# Patient Record
Sex: Female | Born: 1960 | Race: White | Hispanic: No | Marital: Married | Smoking: Current every day smoker
Health system: Southern US, Community
[De-identification: ages and names within clinical notes are randomized; demographics above are authoritative.]

## PROBLEM LIST (undated history)

## (undated) DIAGNOSIS — K529 Noninfective gastroenteritis and colitis, unspecified: Secondary | ICD-10-CM

## (undated) DIAGNOSIS — C801 Malignant (primary) neoplasm, unspecified: Secondary | ICD-10-CM

## (undated) DIAGNOSIS — Z942 Lung transplant status: Secondary | ICD-10-CM

## (undated) DIAGNOSIS — J439 Emphysema, unspecified: Secondary | ICD-10-CM

## (undated) DIAGNOSIS — J449 Chronic obstructive pulmonary disease, unspecified: Secondary | ICD-10-CM

## (undated) HISTORY — PX: LUNG TRANSPLANT, DOUBLE: SHX704

## (undated) HISTORY — PX: TUBAL LIGATION: SHX77

---

## 2006-02-07 ENCOUNTER — Other Ambulatory Visit: Payer: Self-pay

## 2006-02-07 ENCOUNTER — Inpatient Hospital Stay: Payer: Self-pay | Admitting: Psychiatry

## 2006-02-15 ENCOUNTER — Ambulatory Visit: Payer: Self-pay

## 2006-02-16 ENCOUNTER — Ambulatory Visit: Payer: Self-pay

## 2006-05-10 ENCOUNTER — Inpatient Hospital Stay: Payer: Self-pay | Admitting: Internal Medicine

## 2006-05-10 ENCOUNTER — Other Ambulatory Visit: Payer: Self-pay

## 2006-08-28 ENCOUNTER — Emergency Department: Payer: Self-pay | Admitting: Internal Medicine

## 2006-10-20 ENCOUNTER — Other Ambulatory Visit: Payer: Self-pay

## 2006-10-20 ENCOUNTER — Inpatient Hospital Stay: Payer: Self-pay | Admitting: Internal Medicine

## 2007-01-31 ENCOUNTER — Encounter: Payer: Self-pay | Admitting: Internal Medicine

## 2007-02-21 ENCOUNTER — Encounter: Payer: Self-pay | Admitting: Internal Medicine

## 2007-03-24 ENCOUNTER — Encounter: Payer: Self-pay | Admitting: Internal Medicine

## 2007-04-24 ENCOUNTER — Encounter: Payer: Self-pay | Admitting: Internal Medicine

## 2007-05-24 ENCOUNTER — Encounter: Payer: Self-pay | Admitting: Internal Medicine

## 2007-07-23 ENCOUNTER — Emergency Department: Payer: Self-pay | Admitting: Emergency Medicine

## 2007-07-23 ENCOUNTER — Other Ambulatory Visit: Payer: Self-pay

## 2007-07-31 ENCOUNTER — Ambulatory Visit: Payer: Self-pay | Admitting: Internal Medicine

## 2008-04-22 ENCOUNTER — Ambulatory Visit: Payer: Self-pay | Admitting: Family Medicine

## 2008-10-11 ENCOUNTER — Inpatient Hospital Stay: Payer: Self-pay | Admitting: Internal Medicine

## 2008-10-14 ENCOUNTER — Ambulatory Visit: Payer: Self-pay | Admitting: Internal Medicine

## 2009-05-01 ENCOUNTER — Ambulatory Visit: Payer: Self-pay | Admitting: Internal Medicine

## 2010-01-25 ENCOUNTER — Ambulatory Visit: Payer: Self-pay | Admitting: Internal Medicine

## 2010-05-19 ENCOUNTER — Ambulatory Visit: Payer: Self-pay | Admitting: Internal Medicine

## 2010-08-23 DIAGNOSIS — Z942 Lung transplant status: Secondary | ICD-10-CM | POA: Insufficient documentation

## 2010-08-23 HISTORY — DX: Lung transplant status: Z94.2

## 2010-09-03 ENCOUNTER — Ambulatory Visit: Payer: Self-pay | Admitting: Internal Medicine

## 2010-09-11 ENCOUNTER — Ambulatory Visit: Payer: Self-pay | Admitting: Internal Medicine

## 2011-06-04 DIAGNOSIS — E099 Drug or chemical induced diabetes mellitus without complications: Secondary | ICD-10-CM | POA: Insufficient documentation

## 2011-06-04 DIAGNOSIS — D849 Immunodeficiency, unspecified: Secondary | ICD-10-CM | POA: Insufficient documentation

## 2011-06-04 DIAGNOSIS — Z942 Lung transplant status: Secondary | ICD-10-CM | POA: Insufficient documentation

## 2011-07-02 DIAGNOSIS — Z0001 Encounter for general adult medical examination with abnormal findings: Secondary | ICD-10-CM | POA: Insufficient documentation

## 2011-07-02 DIAGNOSIS — Z79899 Other long term (current) drug therapy: Secondary | ICD-10-CM | POA: Insufficient documentation

## 2011-10-27 ENCOUNTER — Ambulatory Visit: Payer: Self-pay | Admitting: Internal Medicine

## 2011-11-09 ENCOUNTER — Ambulatory Visit: Payer: Self-pay | Admitting: Internal Medicine

## 2011-12-01 DIAGNOSIS — E785 Hyperlipidemia, unspecified: Secondary | ICD-10-CM | POA: Insufficient documentation

## 2012-11-13 ENCOUNTER — Ambulatory Visit: Payer: Self-pay | Admitting: Internal Medicine

## 2013-11-15 ENCOUNTER — Ambulatory Visit: Payer: Self-pay | Admitting: Physician Assistant

## 2014-01-22 ENCOUNTER — Ambulatory Visit: Payer: Self-pay

## 2014-03-06 DIAGNOSIS — M1990 Unspecified osteoarthritis, unspecified site: Secondary | ICD-10-CM | POA: Insufficient documentation

## 2014-08-06 DIAGNOSIS — D801 Nonfamilial hypogammaglobulinemia: Secondary | ICD-10-CM | POA: Insufficient documentation

## 2014-11-06 ENCOUNTER — Ambulatory Visit: Payer: Self-pay | Admitting: Nurse Practitioner

## 2015-05-08 ENCOUNTER — Emergency Department: Payer: Medicaid Other

## 2015-05-08 ENCOUNTER — Emergency Department
Admission: EM | Admit: 2015-05-08 | Discharge: 2015-05-08 | Disposition: A | Discharge status: Other Acute Inpatient Hospital | Payer: Medicaid Other | Attending: Emergency Medicine | Admitting: Emergency Medicine

## 2015-05-08 DIAGNOSIS — A4189 Other specified sepsis: Secondary | ICD-10-CM | POA: Insufficient documentation

## 2015-05-08 DIAGNOSIS — Z87891 Personal history of nicotine dependence: Secondary | ICD-10-CM | POA: Diagnosis not present

## 2015-05-08 DIAGNOSIS — J189 Pneumonia, unspecified organism: Secondary | ICD-10-CM

## 2015-05-08 DIAGNOSIS — Z942 Lung transplant status: Secondary | ICD-10-CM | POA: Diagnosis not present

## 2015-05-08 DIAGNOSIS — R0602 Shortness of breath: Secondary | ICD-10-CM | POA: Diagnosis present

## 2015-05-08 DIAGNOSIS — A414 Sepsis due to anaerobes: Secondary | ICD-10-CM

## 2015-05-08 DIAGNOSIS — B961 Klebsiella pneumoniae [K. pneumoniae] as the cause of diseases classified elsewhere: Secondary | ICD-10-CM | POA: Insufficient documentation

## 2015-05-08 DIAGNOSIS — R000 Tachycardia, unspecified: Secondary | ICD-10-CM | POA: Diagnosis not present

## 2015-05-08 DIAGNOSIS — J441 Chronic obstructive pulmonary disease with (acute) exacerbation: Secondary | ICD-10-CM | POA: Diagnosis not present

## 2015-05-08 HISTORY — DX: Emphysema, unspecified: J43.9

## 2015-05-08 HISTORY — DX: Chronic obstructive pulmonary disease, unspecified: J44.9

## 2015-05-08 LAB — BLOOD GAS, ARTERIAL
Acid-base deficit: 2.5 mmol/L — ABNORMAL HIGH (ref 0.0–2.0)
Allens test (pass/fail): POSITIVE — AB
Bicarbonate: 21.7 mEq/L (ref 21.0–28.0)
FIO2: 1
O2 Saturation: 89.2 %
Patient temperature: 37
pCO2 arterial: 35 mmHg (ref 32.0–48.0)
pH, Arterial: 7.4 (ref 7.350–7.450)
pO2, Arterial: 57 mmHg — ABNORMAL LOW (ref 83.0–108.0)

## 2015-05-08 LAB — CBC WITH DIFFERENTIAL/PLATELET
Band Neutrophils: 10 %
Basophils Absolute: 0 10*3/uL (ref 0–0.1)
Basophils Relative: 0 %
Blasts: 0 %
Eosinophils Absolute: 0 10*3/uL (ref 0–0.7)
Eosinophils Relative: 0 %
HCT: 34 % — ABNORMAL LOW (ref 35.0–47.0)
Hemoglobin: 11.6 g/dL — ABNORMAL LOW (ref 12.0–16.0)
Lymphocytes Relative: 24 %
Lymphs Abs: 0.3 10*3/uL — ABNORMAL LOW (ref 1.0–3.6)
MCH: 29.7 pg (ref 26.0–34.0)
MCHC: 34 g/dL (ref 32.0–36.0)
MCV: 87.3 fL (ref 80.0–100.0)
Metamyelocytes Relative: 10 %
Monocytes Absolute: 0 10*3/uL — ABNORMAL LOW (ref 0.2–0.9)
Monocytes Relative: 1 %
Myelocytes: 0 %
Neutro Abs: 0.9 10*3/uL — ABNORMAL LOW (ref 1.4–6.5)
Neutrophils Relative %: 55 %
Other: 0 %
Platelets: 62 10*3/uL — ABNORMAL LOW (ref 150–440)
Promyelocytes Absolute: 0 %
RBC: 3.9 MIL/uL (ref 3.80–5.20)
RDW: 14.9 % — ABNORMAL HIGH (ref 11.5–14.5)
Smear Review: DECREASED
WBC: 1.2 10*3/uL — CL (ref 3.6–11.0)
nRBC: 0 /100 WBC

## 2015-05-08 LAB — COMPREHENSIVE METABOLIC PANEL
ALT: 21 U/L (ref 14–54)
AST: 46 U/L — ABNORMAL HIGH (ref 15–41)
Albumin: 4.3 g/dL (ref 3.5–5.0)
Alkaline Phosphatase: 42 U/L (ref 38–126)
Anion gap: 9 (ref 5–15)
BUN: 24 mg/dL — ABNORMAL HIGH (ref 6–20)
CO2: 23 mmol/L (ref 22–32)
Calcium: 8.5 mg/dL — ABNORMAL LOW (ref 8.9–10.3)
Chloride: 105 mmol/L (ref 101–111)
Creatinine, Ser: 1.73 mg/dL — ABNORMAL HIGH (ref 0.44–1.00)
GFR calc Af Amer: 37 mL/min — ABNORMAL LOW (ref >60)
GFR calc non Af Amer: 32 mL/min — ABNORMAL LOW (ref >60)
Glucose, Bld: 104 mg/dL — ABNORMAL HIGH (ref 65–99)
Potassium: 3.6 mmol/L (ref 3.5–5.1)
Sodium: 137 mmol/L (ref 135–145)
Total Bilirubin: 1 mg/dL (ref 0.3–1.2)
Total Protein: 6.8 g/dL (ref 6.5–8.1)

## 2015-05-08 LAB — PROTIME-INR
INR: 1.02
Prothrombin Time: 13.6 seconds (ref 11.4–15.0)

## 2015-05-08 LAB — LACTIC ACID, PLASMA
Lactic Acid, Venous: 2 mmol/L (ref 0.5–2.0)
Lactic Acid, Venous: 1.3 mmol/L (ref 0.5–2.0)

## 2015-05-08 LAB — TROPONIN I: Troponin I: <0.03 ng/mL (ref <0.031)

## 2015-05-08 MED ORDER — MIDAZOLAM HCL 2 MG/2ML IJ SOLN: 2.0000 mg | INTRAMUSCULAR | Status: DC | PRN

## 2015-05-08 MED ORDER — ETOMIDATE 2 MG/ML IV SOLN
20.0000 mg | Freq: Once | INTRAVENOUS | Status: DC
Start: 2015-05-08 — End: 2015-05-08

## 2015-05-08 MED ORDER — ONDANSETRON HCL 4 MG/2ML IJ SOLN
INTRAMUSCULAR | Status: AC
  Administered 2015-05-08: 4 mg via INTRAVENOUS
  Filled 2015-05-08: qty 2

## 2015-05-08 MED ORDER — NOREPINEPHRINE BITARTRATE 1 MG/ML IV SOLN
0.0000 ug/min | Freq: Once | INTRAVENOUS | Status: DC
  Administered 2015-05-08: 32 ug/min via INTRAVENOUS

## 2015-05-08 MED ORDER — PIPERACILLIN-TAZOBACTAM 3.375 G IVPB 30 MIN
3.3750 g | Freq: Once | INTRAVENOUS | Status: AC
  Administered 2015-05-08: 3.375 g via INTRAVENOUS
  Filled 2015-05-08: qty 50

## 2015-05-08 MED ORDER — SODIUM CHLORIDE 0.9 % IV BOLUS (SEPSIS)
500.0000 mL | Freq: Once | INTRAVENOUS | Status: AC
  Administered 2015-05-08: 500 mL via INTRAVENOUS

## 2015-05-08 MED ORDER — SODIUM CHLORIDE 0.9 % IV BOLUS (SEPSIS): 500.0000 mL | Freq: Once | INTRAVENOUS | Status: DC

## 2015-05-08 MED ORDER — VANCOMYCIN HCL IN DEXTROSE 1-5 GM/200ML-% IV SOLN
1000.0000 mg | Freq: Once | INTRAVENOUS | Status: AC
  Administered 2015-05-08: 1000 mg via INTRAVENOUS
  Filled 2015-05-08: qty 200

## 2015-05-08 MED ORDER — SODIUM CHLORIDE 0.9 % IV BOLUS (SEPSIS)
1000.0000 mL | Freq: Once | INTRAVENOUS | Status: AC
  Administered 2015-05-08: 1000 mL via INTRAVENOUS

## 2015-05-08 MED ORDER — ONDANSETRON HCL 4 MG/2ML IJ SOLN
4.0000 mg | Freq: Once | INTRAMUSCULAR | Status: AC
  Administered 2015-05-08: 4 mg via INTRAVENOUS

## 2015-05-08 MED ORDER — NOREPINEPHRINE 4 MG/250ML-% IV SOLN
INTRAVENOUS | Status: AC
  Filled 2015-05-08: qty 250

## 2015-05-08 MED ORDER — SUCCINYLCHOLINE CHLORIDE 20 MG/ML IJ SOLN: 120.0000 mg | Freq: Once | INTRAMUSCULAR | Status: DC

## 2015-05-08 MED ORDER — NOREPINEPHRINE 4 MG/250ML-% IV SOLN
0.0000 ug/min | Freq: Once | INTRAVENOUS | Status: AC
Start: 2015-05-08 — End: 2015-05-08
  Filled 2015-05-08: qty 250

## 2015-05-08 MED ORDER — ACETAMINOPHEN 500 MG PO TABS
1000.0000 mg | ORAL_TABLET | Freq: Once | ORAL | Status: AC
  Administered 2015-05-08: 1000 mg via ORAL
  Filled 2015-05-08: qty 2

## 2015-05-08 NOTE — ED Provider Notes (Signed)
[facility] Emergency Department Provider Note   ____________________________________________  Time seen: 11:15am I have reviewed the triage vital signs and the triage nursing note.  HISTORY  Chief Complaint Nausea; Emesis; and Diarrhea   Historian Patient, and daughter  HPI Katrina White is a 54 y.o. female who is a history of double lung transplant 4 years ago at [facility], who has a baseline oxygen saturation of 100% per the family, who about 3 days ago started to become somewhat short of breath and have generalized weakness and decreased activity level including fatigue. She's had nausea vomiting and diarrhea for the last 2 days as well. She's had a subjective fever. No chest pain. 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    Past Medical History  Diagnosis Date  . COPD (chronic obstructive pulmonary disease)   . Emphysema lung     There are no active problems to display for this patient.   Past Surgical History  Procedure Laterality Date  . Lung transplant, double    . Tubal ligation      No current outpatient prescriptions on file.  Did not bring her medication list but remembers Bactrim 3 times daily for prevention of infection Mycophenalate Prednisone  Allergies Review of patient's allergies indicates no known allergies.  No family history on file.  Social History Social History  Substance Use Topics  . Smoking status: Former Research scientist (life sciences)  . Smokeless tobacco: None  . Alcohol Use: No    Review of Systems  Constitutional: Positive for fever. Eyes: Negative for visual changes. ENT: Negative for sore throat. Cardiovascular: Negative for chest pain. Respiratory: Positive for shortness of breath. Positive for cough, nonproductive of sputum. Gastrointestinal: Positive for vomiting and diarrhea. Genitourinary: Negative for dysuria. Musculoskeletal: Negative for back pain. Skin: Negative for  rash. Neurological: Negative for headache. 10 point Review of Systems otherwise negative ____________________________________________   PHYSICAL EXAM:  VITAL SIGNS: ED Triage Vitals  Enc Vitals Group     BP 05/08/15 1102 113/65 mmHg     Pulse Rate 05/08/15 1102 123     Resp 05/08/15 1102 22     Temp 05/08/15 1102 100.2 F (37.9 C)     Temp Source 05/08/15 1102 Oral     SpO2 05/08/15 1102 80 %     Weight 05/08/15 1102 110 lb (49.896 kg)     Height 05/08/15 1102 5\' 4"  (1.626 m)     Head Cir --      Peak Flow --      Pain Score 05/08/15 1104 8     Pain Loc --      Pain Edu? --      Excl. in [place]? --      Constitutional: Alert and oriented. Well appearing and in no distress. Eyes: Conjunctivae are normal. PERRL. Normal extraocular movements. ENT   Head: Normocephalic and atraumatic.   Nose: No congestion/rhinnorhea.   Mouth/Throat: Mucous membranes are moist.   Neck: No stridor. Cardiovascular/Chest: Tachycardic, regular rhythm.  No murmurs, rubs, or gallops. Respiratory: Normal respiratory effort without tachypnea nor retractions. Breath sounds are  equal bilaterally. No wheezes/rales.  Mild rhonchi more right-sided. Gastrointestinal: Soft. No distention, no guarding, no rebound. Nontender   Genitourinary/rectal:Deferred Musculoskeletal: Nontender with normal range of motion in all extremities. No joint effusions.  No lower extremity tenderness.  No edema. Neurologic:  Normal speech and language. No gross or focal neurologic deficits are appreciated. Skin:  Skin is warm, dry and intact. No  rash noted. Psychiatric: Mood and affect are normal. Speech and behavior are normal. Patient exhibits appropriate insight and judgment.  ____________________________________________   EKG I, Lisa Roca, MD, the attending physician have personally viewed and interpreted all ECGs.  110 bpm sinus x-ray. Narrow QRS. Normal axis. Nonspecific T  wave. ____________________________________________  LABS (pertinent positives/negatives)  Lactic acid 2.0 Comprehensive metabolic panel significant for BUN 24 cranny 1.73, otherwise without significant abnormality Troponin less than 0.03 White blood cell count 1.2, bands 10.  Absolute neutrophil 0.9 Hemoglobin 11.6, platelet count 62 2 blood cultures sent  PH on nonrebreather 7.4, PCO2 35, PO2 57, bicarbonate 21.7, acid base deficit 2.5, O2 sat 89%  ____________________________________________  RADIOLOGY All Xrays were viewed by me. Imaging interpreted by Radiologist.  Chest x-ray portable: Right-sided pneumonia  Interpreted by radiologist: IMPRESSION: Patchy airspace consolidation within the right midlung. No posttransplant chest radiograph is available for comparison, and therefore it is difficult to determine whether this represents chronic post transplant changes or acute airspace consolidation. Favor acute consolidation given the asymmetry from the left side. __________________________________________  PROCEDURES  Procedure(s) performed: None  Critical Care performed: CRITICAL CARE Performed by: Lisa Roca   Total critical care time: 75 minutes  Critical care time was exclusive of separately billable procedures and treating other patients.  Critical care was necessary to treat or prevent imminent or life-threatening deterioration.  Critical care was time spent personally by me on the following activities: development of treatment plan with patient and/or surrogate as well as nursing, discussions with consultants, evaluation of patient's response to treatment, examination of patient, obtaining history from patient or surrogate, ordering and performing treatments and interventions, ordering and review of laboratory studies, ordering and review of radiographic studies, pulse oximetry and re-evaluation of patient's  condition.   ____________________________________________   ED COURSE / ASSESSMENT AND PLAN  CONSULTATIONS: Phone consultation with lung transplant physician Dr. Pearline Cables who recommended 2 L normal saline bolus followed by 500 cc 2 for total of 3 L bolus, and then if still hypotensive at Levothroid. I also discussed the patient with the accepting physician for the medical ICU, Dr. Ruthann Cancer.  Pertinent labs & imaging results that were available during my care of the patient were reviewed by me and considered in my medical decision making (see chart for details).  Patient arrived with hypoxia and hypotension, concerning for possible sepsis. Patient is a double lung transplant patient who is on immunosuppressants. She has been stable for possibly for years with no baseline O2 requirement.  So this is definitely a change. On nasal cannula at 4 L she was approximately 89%. However when I had her inhale through her nose this did bump up into the mid 90s. However she is not maintaining O2 sat about 90% on nasal cannula, so she was changed to a nonrebreather. On that are with her she was 97%. She's not having any respiratory distress in terms of increased respiratory rate or difficulty speaking. She is able to talk in full sentences. She had a chest pain.  Chest x-ray showed right-sided pneumonia, and her white blood cell count is low risk and concern for neutropenia. And see his 900. Patient was treated with vancomycin and Zosyn as broad-spectrum coverage for sepsis due to pneumonia.  Patient was given 1 L normal saline bolus, and although initial systolic blood pressure was in the 112 area, she did drop her blood pressure into the 66A systolically.  After second liter normal saline, patient's blood pressure was still in the  82-64 systolic range.  At this point I was able to get in contact with [facility] for patient transfer to the medical ICU, after consultation with lung transplant on call physician Dr.  Pearline Cables. She suggested third liter bolus and if hypotensive after that, started Levophed.  She also recommended ABG. This was done and pH is normal.  After the third liter normal saline, patient still hypotensive with a systolic in the 15A, Levothroid was ordered to be started with a goal blood pressure above 100. She does have 2 blood flowing peripheral IVs at this point. She still tachycardic at about 120 bpm.  Patient's respiratory status is stable, she is able to talk comfortably, and is on nonrebreather at around 95% sat. I discussed with her that I don't think she needs intubation this point in time, however is a possibility. Daughter and patient understand.  I'm awaiting bed assignment in the MICU at [facility].  Patient had an episode where her O2 sat dropped into the 80s, and the nurse come to bedside. Patient was actively vomiting. Her waveform was good and her O2 sat was in the 80s. Her blood pressure was still a systolic of 90 and starting on levothyroid at that time at 2 mics per min. As patient stopped vomiting, and preparations were made to consider intubation, and I was trying to await blood pressure did come up a little bit prior to induction medications, including sedative.  Patient's O2 sat did come up on her nonrebreather into the mid 90s, and she is much more comfortable. As I was titrating the levophed for blood pressure control, it was increased to 20 mcg/m. Her breathing did come under good control, with an O2 sat of 100% on nonrebreather. Patient is comfortable breathing and able to talk easily.  At 25 mics/min patient's systolic blood pressure 309. Heart rate 101. O2 sat 100% on nonrebreather.  I discussed prophylactic intubation with the patient and the family, however I don't think this is necessary at this point in time with her O2 sat stabilized, and she looks clinically well with respect to her respiratory status. I think the episode of hypoxia was related to hypoventilation during  nausea and vomiting.  I updated the ICU accepting physician Dr.Marshall who is in agreement with this plan at this time.  Patient care transferred to oncoming physician Dr.Gayle, at 4:20 PM. Patient is waiting critical care transport to [facility] ICU.  Patient / Family / Caregiver informed of clinical course, medical decision-making process, and agree with plan.    ___________________________________________   FINAL CLINICAL IMPRESSION(S) / ED DIAGNOSES   Final diagnoses:  Pneumonia involving right lung, unspecified part of lung  Sepsis due to Klebsiella pneumoniae       Lisa Roca, MD 05/08/15 [id number]

## 2015-05-08 NOTE — ED Notes (Signed)
Pt c/o N/V/D for the past 2 days with fever.the patient has had a double lung transplant pt.Marland Kitchen

## 2015-05-08 NOTE — ED Notes (Signed)
Patient placed on non-rebreather at 14L

## 2015-05-08 NOTE — ED Notes (Signed)
MD notified of decreasing BP.  2nd bolus of fluids ordered.

## 2015-05-13 LAB — CULTURE, BLOOD (ROUTINE X 2)
Culture: NO GROWTH
Culture: NO GROWTH

## 2015-06-04 DIAGNOSIS — Z87448 Personal history of other diseases of urinary system: Secondary | ICD-10-CM | POA: Insufficient documentation

## 2015-06-04 DIAGNOSIS — Z8679 Personal history of other diseases of the circulatory system: Secondary | ICD-10-CM | POA: Insufficient documentation

## 2015-09-19 ENCOUNTER — Ambulatory Visit
Admission: RE | Admit: 2015-09-19 | Discharge: 2015-09-19 | Disposition: A | Discharge status: Home / Self Care | Payer: Medicaid Other | Source: Ambulatory Visit | Attending: Nurse Practitioner | Admitting: Nurse Practitioner

## 2015-09-19 ENCOUNTER — Other Ambulatory Visit: Payer: Self-pay | Admitting: Nurse Practitioner

## 2015-09-19 DIAGNOSIS — M79604 Pain in right leg: Secondary | ICD-10-CM

## 2015-09-19 DIAGNOSIS — M8589 Other specified disorders of bone density and structure, multiple sites: Secondary | ICD-10-CM | POA: Insufficient documentation

## 2015-09-19 DIAGNOSIS — M25551 Pain in right hip: Secondary | ICD-10-CM

## 2015-10-11 DIAGNOSIS — F3341 Major depressive disorder, recurrent, in partial remission: Secondary | ICD-10-CM | POA: Insufficient documentation

## 2015-11-14 DIAGNOSIS — R2689 Other abnormalities of gait and mobility: Secondary | ICD-10-CM | POA: Insufficient documentation

## 2016-01-26 ENCOUNTER — Other Ambulatory Visit: Payer: Self-pay | Admitting: Nurse Practitioner

## 2016-01-26 DIAGNOSIS — Z1231 Encounter for screening mammogram for malignant neoplasm of breast: Secondary | ICD-10-CM

## 2016-02-10 ENCOUNTER — Ambulatory Visit
Admission: RE | Admit: 2016-02-10 | Discharge: 2016-02-10 | Disposition: A | Discharge status: Home / Self Care | Payer: Medicaid Other | Source: Ambulatory Visit | Attending: Nurse Practitioner | Admitting: Nurse Practitioner

## 2016-02-10 DIAGNOSIS — Z1231 Encounter for screening mammogram for malignant neoplasm of breast: Secondary | ICD-10-CM | POA: Diagnosis present

## 2016-02-10 HISTORY — DX: Lung transplant status: Z94.2

## 2016-08-30 DIAGNOSIS — Z85828 Personal history of other malignant neoplasm of skin: Secondary | ICD-10-CM | POA: Insufficient documentation

## 2016-10-29 ENCOUNTER — Ambulatory Visit
Admission: RE | Admit: 2016-10-29 | Discharge: 2016-10-29 | Disposition: A | Discharge status: Home / Self Care | Payer: Medicaid Other | Source: Ambulatory Visit | Attending: Nurse Practitioner | Admitting: Nurse Practitioner

## 2016-10-29 ENCOUNTER — Other Ambulatory Visit: Payer: Self-pay | Admitting: Nurse Practitioner

## 2016-10-29 DIAGNOSIS — J189 Pneumonia, unspecified organism: Secondary | ICD-10-CM

## 2016-10-29 DIAGNOSIS — R062 Wheezing: Secondary | ICD-10-CM | POA: Diagnosis present

## 2016-12-29 ENCOUNTER — Other Ambulatory Visit: Payer: Self-pay | Admitting: Nurse Practitioner

## 2017-01-24 ENCOUNTER — Other Ambulatory Visit: Payer: Self-pay | Admitting: Nurse Practitioner

## 2017-01-24 DIAGNOSIS — Z1231 Encounter for screening mammogram for malignant neoplasm of breast: Secondary | ICD-10-CM

## 2017-05-17 ENCOUNTER — Ambulatory Visit
Admission: RE | Admit: 2017-05-17 | Discharge: 2017-05-17 | Disposition: A | Discharge status: Home / Self Care | Payer: Medicaid Other | Source: Ambulatory Visit | Attending: Nurse Practitioner | Admitting: Nurse Practitioner

## 2017-05-17 DIAGNOSIS — Z1231 Encounter for screening mammogram for malignant neoplasm of breast: Secondary | ICD-10-CM | POA: Insufficient documentation

## 2017-09-28 ENCOUNTER — Other Ambulatory Visit: Payer: Self-pay

## 2017-09-28 ENCOUNTER — Encounter: Payer: Self-pay | Admitting: Emergency Medicine

## 2017-09-28 ENCOUNTER — Emergency Department
Admission: EM | Admit: 2017-09-28 | Discharge: 2017-09-28 | Disposition: A | Discharge status: Home / Self Care | Payer: Medicaid Other | Attending: Emergency Medicine | Admitting: Emergency Medicine

## 2017-09-28 DIAGNOSIS — Z87891 Personal history of nicotine dependence: Secondary | ICD-10-CM | POA: Diagnosis not present

## 2017-09-28 DIAGNOSIS — S67190A Crushing injury of right index finger, initial encounter: Secondary | ICD-10-CM | POA: Diagnosis present

## 2017-09-28 DIAGNOSIS — Y999 Unspecified external cause status: Secondary | ICD-10-CM | POA: Insufficient documentation

## 2017-09-28 DIAGNOSIS — S61217A Laceration without foreign body of left little finger without damage to nail, initial encounter: Secondary | ICD-10-CM | POA: Diagnosis not present

## 2017-09-28 DIAGNOSIS — W231XXA Caught, crushed, jammed, or pinched between stationary objects, initial encounter: Secondary | ICD-10-CM | POA: Insufficient documentation

## 2017-09-28 DIAGNOSIS — Y939 Activity, unspecified: Secondary | ICD-10-CM | POA: Insufficient documentation

## 2017-09-28 DIAGNOSIS — Y929 Unspecified place or not applicable: Secondary | ICD-10-CM | POA: Diagnosis not present

## 2017-09-28 DIAGNOSIS — S61210A Laceration without foreign body of right index finger without damage to nail, initial encounter: Secondary | ICD-10-CM

## 2017-09-28 DIAGNOSIS — J449 Chronic obstructive pulmonary disease, unspecified: Secondary | ICD-10-CM | POA: Diagnosis not present

## 2017-09-28 NOTE — ED Notes (Addendum)
Pt has laceration to right index finger and left 5th finger.  Pt was moving a box and struck the metal shelving with her hands.  Bleeding controlled.  Good distal pulses.  Pt alert.

## 2017-09-28 NOTE — Discharge Instructions (Signed)
Please keep dry for the next 24 hours. Please return with any worsened pain, redness or drainage to the area

## 2017-09-28 NOTE — ED Triage Notes (Signed)
Patient ambulatory to triage with steady gait, without difficulty or distress noted; Pt reports about 3hrs PTA stacking boxes on metal shelf; laceration to right index finger and left 5th finger

## 2017-09-28 NOTE — ED Provider Notes (Signed)
[facility] Emergency Department Provider Note   ____________________________________________   First MD Initiated Contact with Patient 09/28/17 0032     (approximate)  I have reviewed the triage vital signs and the nursing notes.   HISTORY  Chief Complaint Laceration    HPI Katrina White is a 57 y.o. female who comes into the hospital today with a laceration to her fingers.  She reports that she smashed her fingers under some boxes.  She was putting some boxes away on a metal shelf that was 10 pounds each.  She reports that when she tried to put on the shelf it slammed down on her fingers.  The patient reports that her last tetanus was about 6 years ago.  The patient states that her pain is a 4 out of 10 in intensity.  She injured her right index finger in her left pinky finger.  She reports that she had a numb feeling initially but it is improving.  She just wanted a doctor to look at it.  She thinks it looks okay but wanted evaluated.  She put some liquid bandage on the index finger injury to help with the bleeding.  Past Medical History:  Diagnosis Date  . COPD (chronic obstructive pulmonary disease) ([place])   . Emphysema lung ([place])   . 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   Past Surgical History:  Procedure Laterality Date  . LUNG TRANSPLANT, DOUBLE    . TUBAL LIGATION      Prior to Admission medications   Not on File    Allergies Nsaids  Family History  Problem Relation Age of Onset  . Breast cancer Neg Hx     Social History Social History   Tobacco Use  . Smoking status: Former Research scientist (life sciences)  . Smokeless tobacco: Never Used  Substance Use Topics  . Alcohol use: No  . Drug use: Not on file    Review of Systems  Constitutional: No fever/chills Eyes: No visual changes. ENT: No sore throat. Cardiovascular: Denies chest pain. Respiratory: Denies shortness of  breath. Gastrointestinal: No abdominal pain.  No nausea, no vomiting.  No diarrhea.  No constipation. Genitourinary: Negative for dysuria. Musculoskeletal: Negative for back pain. Skin: Laceration to left small finger and right index finger Neurological: Negative for headaches, focal weakness or numbness.   ____________________________________________   PHYSICAL EXAM:  VITAL SIGNS: ED Triage Vitals  Enc Vitals Group     BP 09/28/17 0021 (!) 131/93     Pulse Rate 09/28/17 0021 96     Resp 09/28/17 0021 18     Temp 09/28/17 0021 97.7 F (36.5 C)     Temp Source 09/28/17 0021 Oral     SpO2 09/28/17 0021 98 %     Weight 09/28/17 0020 100 lb (45.4 kg)     Height 09/28/17 0020 5\' 4"  (1.626 m)     Head Circumference --      Peak Flow --      Pain Score 09/28/17 0021 3     Pain Loc --      Pain Edu? --      Excl. in [place]? --     Constitutional: Alert and oriented. Well appearing and in mild distress. Eyes: Conjunctivae are normal. PERRL. EOMI. Head: Atraumatic. Nose: No congestion/rhinnorhea. Mouth/Throat: Mucous membranes are moist.  Oropharynx non-erythematous. Cardiovascular: Normal rate, regular rhythm. Grossly normal heart sounds.  Good peripheral  circulation. Respiratory: Normal respiratory effort.  No retractions. Lungs CTAB. Gastrointestinal: Soft and nontender. No distention.  Positive bowel sounds Musculoskeletal: No lower extremity tenderness nor edema.   Neurologic:  Normal speech and language.  Skin:  Skin is warm, dry small skin tear to right index finger between the DIP and PIP.  Skin wound approximated well, skin tear to left fifth digit above the DIP edges approximated well Psychiatric: Mood and affect are normal.   ____________________________________________   LABS (all labs ordered are listed, but only abnormal results are displayed)  Labs Reviewed - No data to  display ____________________________________________  EKG  none ____________________________________________  RADIOLOGY  ED MD interpretation:  none  Official radiology report(s): No results found.  ____________________________________________   PROCEDURES  Procedure(s) performed: None  Procedures  Critical Care performed: No  ____________________________________________   INITIAL IMPRESSION / ASSESSMENT AND PLAN / ED COURSE  As part of my medical decision making, I reviewed the following data within the electronic MEDICAL RECORD NUMBER Notes from prior ED visits and  Controlled Substance Database   This is a 57 year old female who comes into the hospital today with some lacerations to her right index finger and left fifth small finger.  The lacerations are skin tears that are not gaping open.  They do approximate well and the bleeding is controlled.  I do not feel that the patient's injuries needs sutures.  I did use some Steri-Strips to pull the wound together on her index finger and small finger.  The patient has been instructed to bandage it to help keep it from moving for the next 24 hours and to not get it wet for 24 hours.  The patient will be discharged to follow-up with her primary care physician.      ____________________________________________   FINAL CLINICAL IMPRESSION(S) / ED DIAGNOSES  Final diagnoses:  Laceration of right index finger without foreign body without damage to nail, initial encounter  Laceration of left little finger without foreign body without damage to nail, initial encounter     ED Discharge Orders    None       Note:  This document was prepared using Dragon voice recognition software and may include unintentional dictation errors.    Loney Hering, MD 09/28/17 [phone]

## 2017-10-07 DIAGNOSIS — F17200 Nicotine dependence, unspecified, uncomplicated: Secondary | ICD-10-CM | POA: Insufficient documentation

## 2017-10-09 DIAGNOSIS — N183 Chronic kidney disease, stage 3 unspecified: Secondary | ICD-10-CM | POA: Insufficient documentation

## 2017-10-09 DIAGNOSIS — E46 Unspecified protein-calorie malnutrition: Secondary | ICD-10-CM | POA: Insufficient documentation

## 2017-10-31 ENCOUNTER — Ambulatory Visit: Payer: Self-pay | Admitting: Nurse Practitioner

## 2017-11-24 ENCOUNTER — Ambulatory Visit: Payer: Self-pay | Admitting: Nurse Practitioner

## 2017-12-30 ENCOUNTER — Ambulatory Visit: Payer: Medicaid Other | Admitting: Nurse Practitioner

## 2017-12-30 ENCOUNTER — Encounter: Payer: Self-pay | Admitting: Nurse Practitioner

## 2017-12-30 VITALS — BP 111/75 | HR 72 | Resp 16 | Ht 64.0 in | Wt 97.4 lb

## 2017-12-30 DIAGNOSIS — R197 Diarrhea, unspecified: Secondary | ICD-10-CM

## 2017-12-30 DIAGNOSIS — M064 Inflammatory polyarthropathy: Secondary | ICD-10-CM | POA: Diagnosis not present

## 2017-12-30 DIAGNOSIS — Z942 Lung transplant status: Secondary | ICD-10-CM

## 2017-12-30 MED ORDER — PREDNISONE 10 MG (21) PO TBPK: ORAL_TABLET | ORAL | 0 refills | Status: AC

## 2017-12-30 NOTE — Progress Notes (Signed)
[facility] [place], [place] [address]  Internal MEDICINE  Office Visit Note  Patient Name: Katrina White  [id number]  [id number]  Date of Service: 01/23/2018   Pt is here for routine follow up.   Chief Complaint  Patient presents with  . Follow-up    30month follow up.  . swollen hands    has been swollen a while went to urgent care and got an xray they said ontusion but pt doesnt believe that is the case. very painful    The patient has swelling and tenderness in the joints of her hands. Hurts to make a fist or move her fingers. Was seen in urgent care a few weeks ago because of this pain. Was told she had hematoma to dorsal surface of right hand and some arthritic cells in the joints. 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  She is also c/o diarrhea. Has 6 to 8 loose bowel movements per day. Will sometimes get nauseated and vomit during these episodes. Has had C.diff in the past due to medications from lung transplant. She states that she isn't sure, but this feels very similar. Denies cramping or stomach pain. Denies fever.       Current Medication: Outpatient Encounter Medications as of 12/30/2017  Medication Sig  . acetaminophen (TYLENOL) 500 MG tablet Take by mouth.  Marland Kitchen aspirin 81 MG chewable tablet Chew by mouth.  Marland Kitchen atorvastatin (LIPITOR) 40 MG tablet Take by mouth.  Marland Kitchen atorvastatin (LIPITOR) 40 MG tablet Take 40 mg by mouth daily.  . Biotin (BIOTIN MAXIMUM STRENGTH) 5 MG CAPS Take by mouth.  . calcium citrate-vitamin D (CITRACAL+D) 315-200 MG-UNIT tablet Take by mouth.  . CHANTIX STARTING MONTH PAK 0.5 MG X 11 & 1 MG X 42 tablet FPD  . escitalopram (LEXAPRO) 10 MG tablet TK 1 T PO QD  . magnesium oxide (MAG-OX) 400 MG tablet Take by mouth.  . mirtazapine (REMERON) 15 MG tablet Take 15 mg by mouth at bedtime.  . Multiple Vitamin (MULTI-VITAMINS) TABS Take by mouth.  . mycophenolate  (CELLCEPT) 500 MG tablet TK 1 T PO BID  . predniSONE (DELTASONE) 5 MG tablet TK 1 T PO  QD  . sulfamethoxazole-trimethoprim (BACTRIM,SEPTRA) 400-80 MG tablet Take by mouth.  . tacrolimus (PROGRAF) 1 MG capsule Take 2 mg by mouth 2 (two) times daily.  Marland Kitchen thiamine 100 MG tablet Take by mouth.  . predniSONE (STERAPRED UNI-PAK 21 TAB) 10 MG (21) TBPK tablet 6 day taper - take by mouth as directed for 6 days   No facility-administered encounter medications on file as of 12/30/2017.     Surgical History: Past Surgical History:  Procedure Laterality Date  . LUNG TRANSPLANT, DOUBLE    . TUBAL LIGATION      Medical History: Past Medical History:  Diagnosis Date  . COPD (chronic obstructive pulmonary disease) ([place])   . Emphysema lung ([place])   . 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 Breast cancer Neg Hx     Social History   Socioeconomic History  . Marital status: Married    Spouse name: Not on file  . Number of children: Not on file  . Years of education: Not on file  . Highest education level: Not on file  Occupational History  . Not on file  Social Needs  . Financial  resource strain: Not on file  . Food insecurity:    Worry: Not on file    Inability: Not on file  . Transportation needs:    Medical: Not on file    Non-medical: Not on file  Tobacco Use  . Smoking status: Current Every Day Smoker    Types: Cigarettes  . Smokeless tobacco: Never Used  . Tobacco comment: less than a pack a week plan to quit for mothers day  Substance and Sexual Activity  . Alcohol use: Yes    Comment: socially (3-4drinks a week)  . Drug use: Never  . Sexual activity: Not on file  Lifestyle  . Physical activity:    Days per week: Not on file    Minutes per session: Not on file  . Stress: Not on file  Relationships  . Social connections:    Talks on phone: Not on file    Gets together: Not on file    Attends  religious service: Not on file    Active member of club or organization: Not on file    Attends meetings of clubs or organizations: Not on file    Relationship status: Not on file  . Intimate partner violence:    Fear of current or ex partner: Not on file    Emotionally abused: Not on file    Physically abused: Not on file    Forced sexual activity: Not on file  Other Topics Concern  . Not on file  Social History Narrative  . Not on file      Review of Systems  Constitutional: Positive for fatigue. Negative for chills and unexpected weight change.  HENT: Negative for congestion, postnasal drip, rhinorrhea, sneezing and sore throat.   Eyes: Negative.  Negative for redness.  Respiratory: Negative for cough, chest tightness, shortness of breath and wheezing.   Cardiovascular: Negative for chest pain and palpitations.  Gastrointestinal: Positive for diarrhea. Negative for abdominal pain, constipation, nausea and vomiting.       Intermittent cramping with frequent loose stools.  Endocrine: Negative for cold intolerance, heat intolerance, polydipsia, polyphagia and polyuria.  Genitourinary: Negative for dysuria and frequency.  Musculoskeletal: Positive for arthralgias and myalgias. Negative for back pain, joint swelling and neck pain.  Skin: Negative for rash.  Allergic/Immunologic: Positive for environmental allergies.  Neurological: Negative.  Negative for tremors and numbness.  Hematological: Negative for adenopathy. Does not bruise/bleed easily.  Psychiatric/Behavioral: Negative for behavioral problems (Depression), sleep disturbance and suicidal ideas. The patient is not nervous/anxious.     Today's Vitals   12/30/17 1356  BP: 111/75  Pulse: 72  Resp: 16  SpO2: 94%  Weight: 97 lb 6.4 oz (44.2 kg)  Height: 5\' 4"  (1.626 m)    Physical Exam  Constitutional: She is oriented to person, place, and time. She appears well-developed and well-nourished. No distress.  HENT:  Head:  Normocephalic and atraumatic.  Nose: Nose normal.  Mouth/Throat: Oropharynx is clear and moist. No oropharyngeal exudate.  Eyes: Pupils are equal, round, and reactive to light. Conjunctivae and EOM are normal.  Neck: Normal range of motion. Neck supple. No JVD present. No tracheal deviation present. No thyromegaly present.  Cardiovascular: Normal rate, regular rhythm and normal heart sounds. Exam reveals no gallop and no friction rub.  No murmur heard. Pulmonary/Chest: Effort normal and breath sounds normal. No respiratory distress. She has no wheezes. She has no rales. She exhibits no tenderness.  Abdominal: Soft. Bowel sounds are normal. There is tenderness.  Mild  generalized abdominal tenderness. No guarding with light and moderate palpation.   Musculoskeletal: Normal range of motion.  Mild tenderness and moderate stiffness of the joints of the fingers. Most severe in medial and proximal joints of the fingers.   Lymphadenopathy:    She has no cervical adenopathy.  Neurological: She is alert and oriented to person, place, and time. No cranial nerve deficit.  Skin: Skin is warm and dry. She is not diaphoretic.  Psychiatric: She has a normal mood and affect. Her behavior is normal. Judgment and thought content normal.  Nursing note and vitals reviewed.  Assessment/Plan:  1. Inflammatory polyarthritis (HCC) Prednisone 10mg  dose pack. Take as directed for 6 days to reduce pain and inflammation of the fingers/hands.  - predniSONE (STERAPRED UNI-PAK 21 TAB) 10 MG (21) TBPK tablet; 6 day taper - take by mouth as directed for 6 days  Dispense: 21 tablet; Refill: 0  2. Diarrhea, unspecified type Check stool for presence of infection. Treat as indicated.  - Cdiff NAA+O+P+Stool Culture  3. S/P lung transplant ([place]) Regular visits with lung transplant center as scheduled    General Counseling: Sherrelle verbalizes understanding of the findings of todays visit and agrees with plan of treatment. I  have discussed any further diagnostic evaluation that may be needed or ordered today. We also reviewed her medications today. she has been encouraged to call the office with any questions or concerns that should arise related to todays visit.  This patient was seen by Leretha Pol, FNP- C in Collaboration with Dr Lavera Guise as a part of collaborative care agreement   Orders Placed This Encounter  Procedures  . Cdiff NAA+O+P+Stool Culture    Meds ordered this encounter  Medications  . predniSONE (STERAPRED UNI-PAK 21 TAB) 10 MG (21) TBPK tablet    Sig: 6 day taper - take by mouth as directed for 6 days    Dispense:  21 tablet    Refill:  0    Order Specific Question:   Supervising Provider    Answer:   Lavera Guise [[id number]]    Time spent: 2 Minutes     Dr Lavera Guise Internal medicine

## 2018-01-23 DIAGNOSIS — M064 Inflammatory polyarthropathy: Secondary | ICD-10-CM | POA: Insufficient documentation

## 2018-01-23 DIAGNOSIS — R197 Diarrhea, unspecified: Secondary | ICD-10-CM | POA: Insufficient documentation

## 2018-04-07 ENCOUNTER — Emergency Department
Admission: EM | Admit: 2018-04-07 | Discharge: 2018-04-07 | Disposition: A | Discharge status: Home / Self Care | Payer: Medicaid Other | Attending: Emergency Medicine | Admitting: Emergency Medicine

## 2018-04-07 ENCOUNTER — Other Ambulatory Visit: Payer: Self-pay

## 2018-04-07 ENCOUNTER — Emergency Department: Payer: Medicaid Other

## 2018-04-07 ENCOUNTER — Encounter: Payer: Self-pay | Admitting: Emergency Medicine

## 2018-04-07 DIAGNOSIS — Z79899 Other long term (current) drug therapy: Secondary | ICD-10-CM | POA: Diagnosis not present

## 2018-04-07 DIAGNOSIS — Z7982 Long term (current) use of aspirin: Secondary | ICD-10-CM | POA: Diagnosis not present

## 2018-04-07 DIAGNOSIS — J449 Chronic obstructive pulmonary disease, unspecified: Secondary | ICD-10-CM | POA: Diagnosis not present

## 2018-04-07 DIAGNOSIS — F17210 Nicotine dependence, cigarettes, uncomplicated: Secondary | ICD-10-CM | POA: Diagnosis not present

## 2018-04-07 DIAGNOSIS — K529 Noninfective gastroenteritis and colitis, unspecified: Secondary | ICD-10-CM | POA: Diagnosis not present

## 2018-04-07 DIAGNOSIS — R197 Diarrhea, unspecified: Secondary | ICD-10-CM | POA: Diagnosis present

## 2018-04-07 HISTORY — DX: Malignant (primary) neoplasm, unspecified: C80.1

## 2018-04-07 LAB — GASTROINTESTINAL PANEL BY PCR, STOOL (REPLACES STOOL CULTURE)

## 2018-04-07 LAB — URINALYSIS, COMPLETE (UACMP) WITH MICROSCOPIC
Bilirubin Urine: NEGATIVE
Glucose, UA: NEGATIVE mg/dL
Ketones, ur: NEGATIVE mg/dL
Leukocytes, UA: NEGATIVE
Nitrite: NEGATIVE
Protein, ur: NEGATIVE mg/dL
Specific Gravity, Urine: 1.018 (ref 1.005–1.030)
pH: 5 (ref 5.0–8.0)

## 2018-04-07 LAB — COMPREHENSIVE METABOLIC PANEL
ALT: 17 U/L (ref 0–44)
AST: 28 U/L (ref 15–41)
Albumin: 4.3 g/dL (ref 3.5–5.0)
Alkaline Phosphatase: 43 U/L (ref 38–126)
Anion gap: 7 (ref 5–15)
BUN: 29 mg/dL — ABNORMAL HIGH (ref 6–20)
CO2: 28 mmol/L (ref 22–32)
Calcium: 9.2 mg/dL (ref 8.9–10.3)
Chloride: 106 mmol/L (ref 98–111)
Creatinine, Ser: 1.21 mg/dL — ABNORMAL HIGH (ref 0.44–1.00)
GFR calc Af Amer: 56 mL/min — ABNORMAL LOW (ref >60)
GFR calc non Af Amer: 48 mL/min — ABNORMAL LOW (ref >60)
Glucose, Bld: 125 mg/dL — ABNORMAL HIGH (ref 70–99)
Potassium: 3.6 mmol/L (ref 3.5–5.1)
Sodium: 141 mmol/L (ref 135–145)
Total Bilirubin: 1 mg/dL (ref 0.3–1.2)
Total Protein: 6.8 g/dL (ref 6.5–8.1)

## 2018-04-07 LAB — CBC
HCT: 38.5 % (ref 35.0–47.0)
Hemoglobin: 13.2 g/dL (ref 12.0–16.0)
MCH: 31.5 pg (ref 26.0–34.0)
MCHC: 34.2 g/dL (ref 32.0–36.0)
MCV: 91.8 fL (ref 80.0–100.0)
Platelets: 105 10*3/uL — ABNORMAL LOW (ref 150–440)
RBC: 4.19 MIL/uL (ref 3.80–5.20)
RDW: 14.2 % (ref 11.5–14.5)
WBC: 8.7 10*3/uL (ref 3.6–11.0)

## 2018-04-07 LAB — LIPASE, BLOOD: Lipase: 33 U/L (ref 11–51)

## 2018-04-07 LAB — C DIFFICILE QUICK SCREEN W PCR REFLEX
C Diff antigen: NEGATIVE
C Diff interpretation: NOT DETECTED
C Diff toxin: NEGATIVE

## 2018-04-07 MED ORDER — METRONIDAZOLE 500 MG PO TABS
500.0000 mg | ORAL_TABLET | Freq: Once | ORAL | Status: AC
  Administered 2018-04-07: 500 mg via ORAL
  Filled 2018-04-07: qty 1

## 2018-04-07 MED ORDER — AMOXICILLIN-POT CLAVULANATE 875-125 MG PO TABS
1.0000 | ORAL_TABLET | Freq: Once | ORAL | Status: AC
  Administered 2018-04-07: 1 via ORAL
  Filled 2018-04-07: qty 1

## 2018-04-07 MED ORDER — IOHEXOL 300 MG/ML  SOLN
75.0000 mL | Freq: Once | INTRAMUSCULAR | Status: AC | PRN
  Administered 2018-04-07: 75 mL via INTRAVENOUS

## 2018-04-07 MED ORDER — IOPAMIDOL (ISOVUE-300) INJECTION 61%
30.0000 mL | Freq: Once | INTRAVENOUS | Status: AC | PRN
Start: 2018-04-07 — End: 2018-04-07
  Administered 2018-04-07: 30 mL via ORAL

## 2018-04-07 MED ORDER — AMOXICILLIN-POT CLAVULANATE 875-125 MG PO TABS: 1.0000 | ORAL_TABLET | Freq: Two times a day (BID) | ORAL | 0 refills | Status: AC

## 2018-04-07 MED ORDER — SODIUM CHLORIDE 0.9 % IV BOLUS: 1000.0000 mL | Freq: Once | INTRAVENOUS | Status: DC

## 2018-04-07 MED ORDER — METRONIDAZOLE 500 MG PO TABS: 500.0000 mg | ORAL_TABLET | Freq: Three times a day (TID) | ORAL | 0 refills | Status: AC

## 2018-04-07 MED ORDER — ONDANSETRON 4 MG PO TBDP: 4.0000 mg | ORAL_TABLET | Freq: Three times a day (TID) | ORAL | 0 refills | Status: DC | PRN

## 2018-04-07 NOTE — ED Provider Notes (Signed)
-----------------------------------------   5:37 PM on 04/07/2018 -----------------------------------------   Blood pressure (!) 152/89, pulse 95, temperature 98.9 F (37.2 C), temperature source Oral, resp. rate 16, height 5\' 4"  (1.626 m), weight 43.1 kg, SpO2 98 %.  Assuming care from Dr. Cherylann Banas of Katrina White is a 57 y.o. female with a chief complaint of Diarrhea .    Please refer to H&P by previous MD for further details.  The current plan of care is to f/u CT and C. Diff testing.   CT showing colitis. C. Diff negative. Patient started on flagyl and augmentin and will be dc home with follow-up with primary care doctor.  Discussed return precautions for worsening pain, fever, rectal bleeding, melena.   I have personally reviewed the images performed during this visit and I agree with the Radiologist's read.   Interpretation by Radiologist:  Ct Abdomen Pelvis W Contrast  Result Date: 04/07/2018 CLINICAL DATA:  Diarrhea for 2 weeks. Low abdominal pain. History of lung transplant and Clostridium difficile colitis. EXAM: CT ABDOMEN AND PELVIS WITH CONTRAST TECHNIQUE: Multidetector CT imaging of the abdomen and pelvis was performed using the standard protocol following bolus administration of intravenous contrast. CONTRAST:  25mL OMNIPAQUE IOHEXOL 300 MG/ML  SOLN COMPARISON:  Abdomen ultrasound 05/19/2010. FINDINGS: Lower chest: Postsurgical changes in the lower chest with mild linear atelectasis or scarring in both lungs. No significant pleural effusion. There is mild aortic atherosclerosis. Hepatobiliary: The liver is normal in density without focal abnormality. No evidence of gallstones, gallbladder wall thickening or biliary dilatation. Pancreas: Unremarkable. No pancreatic ductal dilatation or surrounding inflammatory changes. Spleen: Normal in size without focal abnormality. Adrenals/Urinary Tract: Both adrenal glands appear normal. The kidneys appear normal without evidence of  urinary tract calculus, suspicious lesion or hydronephrosis. No bladder abnormalities are seen. Stomach/Bowel: Possible mild wall thickening of the distal esophagus without significant hiatal hernia. The stomach and small bowel appear normal. The appendix is not clearly seen. There is mild to moderate wall thickening throughout the colon, from cecum to rectum. This wall thickening appears greatest in the rectosigmoid colon. There is mild inflammation in the surrounding pelvic fat. No significant bowel distension. Vascular/Lymphatic: There are no enlarged abdominal or pelvic lymph nodes. Aortic and branch vessel atherosclerosis. No acute vascular findings. Mild asymmetric dilatation of the left gonadal vein. Reproductive: The uterus and ovaries appear normal. Other: No ascites, free air or focal extraluminal fluid collection. Musculoskeletal: No acute or significant osseous findings. 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 IMPRESSION: 1. Diffuse colonic wall thickening consistent with colitis, likely pseudomembranous colitis (Clostridium difficile infection). No evidence of bowel obstruction, perforation or abscess. 2. Possible mild wall thickening of the distal esophagus. 3.  Aortic Atherosclerosis (ICD10-I70.0). Electronically Signed   By: Richardean Sale M.D.   On: 04/07/2018 17:32           Rudene Re, MD 04/07/18 [phone]

## 2018-04-07 NOTE — ED Notes (Signed)
.   Pt is resting, Respirations even and unlabored, NAD. Stretcher lowest postion and locked. Call bell within reach. Denies any needs at this time RN will continue to monitor.    

## 2018-04-07 NOTE — ED Notes (Signed)
E signature pad not functioning. Pt educated about discharge and verbalized undestanding

## 2018-04-07 NOTE — ED Triage Notes (Addendum)
Pt to ED via POV with c/o diarrhea and abd pain x2wks. Pt denies fever. PT states she has noticed blood in stool, describes stool as red. Pt on chronic bactrim due to recipient  of double lung transplant in 2015. Hx of c-diff .VSS

## 2018-04-07 NOTE — ED Provider Notes (Signed)
[facility] Emergency Department Provider Note ____________________________________________   First MD Initiated Contact with Patient 04/07/18 1426     (approximate)  I have reviewed the triage vital signs and the nursing notes.   HISTORY  Chief Complaint Diarrhea    HPI Katrina White is a 57 y.o. female with PMH as noted below including bilateral lung transplant (chronically on Bactrim) as well as prior history of C. difficile who presents with diarrhea over the last 2 weeks, approximately 10 episodes per day, watery and intermittently bloody, and associated in the last day with vomiting.  Patient denies any hematemesis.  She reports crampy but persistent lower abdominal pain, as well as generalized weakness.  She denies any urinary symptoms.   Past Medical History:  Diagnosis Date  . COPD (chronic obstructive pulmonary disease) ([place])   . Emphysema lung ([place])   . 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 Inflammatory polyarthritis ([place]) 01/23/2018  . Diarrhea 01/23/2018  . S/P lung transplant ([place]) 08/23/2010    Past Surgical History:  Procedure Laterality Date  . LUNG TRANSPLANT, DOUBLE    . TUBAL LIGATION      Prior to Admission medications   Medication Sig Start Date End Date Taking? Authorizing Provider  acetaminophen (TYLENOL) 500 MG tablet Take by mouth. 05/17/11   [provider]  aspirin 81 MG chewable tablet Chew by mouth. 05/17/11   [provider]  atorvastatin (LIPITOR) 40 MG tablet Take by mouth. 05/25/17   [provider]  atorvastatin (LIPITOR) 40 MG tablet Take 40 mg by mouth daily. 11/02/17   [provider]  Biotin (BIOTIN MAXIMUM STRENGTH) 5 MG CAPS Take by mouth.    [provider]  calcium citrate-vitamin D (CITRACAL+D) 315-200 MG-UNIT tablet Take by mouth. 05/12/11   [provider]  CHANTIX STARTING MONTH PAK 0.5  MG X 11 & 1 MG X 42 tablet FPD 10/31/17   [provider]  escitalopram (LEXAPRO) 10 MG tablet TK 1 T PO QD 11/02/17   [provider]  magnesium oxide (MAG-OX) 400 MG tablet Take by mouth. 12/23/17   [provider]  mirtazapine (REMERON) 15 MG tablet Take 15 mg by mouth at bedtime. 11/02/17   [provider]  Multiple Vitamin (MULTI-VITAMINS) TABS Take by mouth. 09/29/10   [provider]  mycophenolate (CELLCEPT) 500 MG tablet TK 1 T PO BID 10/16/17   [provider]  predniSONE (DELTASONE) 5 MG tablet TK 1 T PO  QD 11/02/17   [provider]  predniSONE (STERAPRED UNI-PAK 21 TAB) 10 MG (21) TBPK tablet 6 day taper - take by mouth as directed for 6 days 12/30/17   Ronnell Freshwater, NP  sulfamethoxazole-trimethoprim (BACTRIM,SEPTRA) 400-80 MG tablet Take by mouth. 10/31/17   [provider]  tacrolimus (PROGRAF) 1 MG capsule Take 2 mg by mouth 2 (two) times daily. 12/28/17   [provider]  thiamine 100 MG tablet Take by mouth. 10/13/15   [provider]    Allergies Nsaids  Family History  Problem Relation Age of Onset  . Breast cancer Neg Hx     Social History Social History   Tobacco Use  . Smoking status: Current Every Day Smoker    Types: Cigarettes  . Smokeless tobacco: Never Used  . Tobacco comment: less than a pack a week plan to quit for mothers day  Substance Use  Topics  . Alcohol use: Yes    Comment: socially (3-4drinks a week)  . Drug use: Never    Review of Systems  Constitutional: No fever. Eyes: No redness. ENT: No sore throat. Cardiovascular: Denies chest pain. Respiratory: Denies shortness of breath. Gastrointestinal: Positive for diarrhea.  Genitourinary: Negative for dysuria.  Musculoskeletal: Negative for back pain. Skin: Negative for rash. Neurological: Negative for headache.   ____________________________________________   PHYSICAL EXAM:  VITAL SIGNS: ED Triage  Vitals  Enc Vitals Group     BP 04/07/18 1233 (!) 142/72     Pulse Rate 04/07/18 1233 95     Resp 04/07/18 1233 16     Temp 04/07/18 1233 98.9 F (37.2 C)     Temp Source 04/07/18 1233 Oral     SpO2 04/07/18 1233 98 %     Weight 04/07/18 1234 95 lb (43.1 kg)     Height 04/07/18 1234 5\' 4"  (1.626 m)     Head Circumference --      Peak Flow --      Pain Score 04/07/18 1234 1     Pain Loc --      Pain Edu? --      Excl. in [place]? --     Constitutional: Alert and oriented.  Slightly uncomfortable appearing but no acute distress.   Eyes: Conjunctivae are normal.  No scleral icterus. Head: Atraumatic. Nose: No congestion/rhinnorhea. Mouth/Throat: Mucous membranes are slightly dry.   Neck: Normal range of motion.  Cardiovascular: Good peripheral circulation. Respiratory: Normal respiratory effort.  No retractions. Gastrointestinal: Soft with mild bilateral lower quadrant tenderness. No distention.  Genitourinary: No flank tenderness. Musculoskeletal: Extremities warm and well perfused.  Neurologic:  Normal speech and language. No gross focal neurologic deficits are appreciated.  Skin:  Skin is warm and dry. No rash noted. Psychiatric: Mood and affect are normal. Speech and behavior are normal.  ____________________________________________   LABS (all labs ordered are listed, but only abnormal results are displayed)  Labs Reviewed  COMPREHENSIVE METABOLIC PANEL - Abnormal; Notable for the following components:      Result Value   Glucose, Bld 125 (*)    BUN 29 (*)    Creatinine, Ser 1.21 (*)    GFR calc non Af Amer 48 (*)    GFR calc Af Amer 56 (*)    All other components within normal limits  CBC - Abnormal; Notable for the following components:   Platelets 105 (*)    All other components within normal limits  URINALYSIS, COMPLETE (UACMP) WITH MICROSCOPIC - Abnormal; Notable for the following components:   Color, Urine YELLOW (*)    APPearance CLEAR (*)    Hgb urine  dipstick MODERATE (*)    Bacteria, UA RARE (*)    All other components within normal limits  C DIFFICILE QUICK SCREEN W PCR REFLEX  GASTROINTESTINAL PANEL BY PCR, STOOL (REPLACES STOOL CULTURE)  LIPASE, BLOOD   ____________________________________________  EKG   ____________________________________________  RADIOLOGY  CT abdomen: Pending ____________________________________________   PROCEDURES  Procedure(s) performed: No  Procedures  Critical Care performed: No ____________________________________________   INITIAL IMPRESSION / ASSESSMENT AND PLAN / ED COURSE  Pertinent labs & imaging results that were available during my care of the patient were reviewed by me and considered in my medical decision making (see chart for details).  57 year old female with PMH as noted above and who is on chronic antibiotics with a prior history of C. difficile presents with diarrhea over the last  2 weeks, lower abdominal pain, and vomiting over the last day.  I reviewed the past medical records in Epic; the patient was most recently seen in the ED in February of this year for a laceration and in 2016 for shortness of breath.  On exam, she is somewhat weak and frail appearing but in no acute distress.  Her vital signs are normal.  The abdomen is soft with some relatively mild lower abdominal tenderness.  She appears slightly dehydrated.  Differential includes colitis, diverticulitis, C. difficile colitis, gastroenteritis, or UTI/cystitis.  Plan: Lab work-up, C. difficile stool studies, CT abdomen, IV fluids, and reassess.  ----------------------------------------- 3:20 PM on 04/07/2018 -----------------------------------------  Lab work-up is unremarkable.  UA is negative.  Patient is pending C. difficile PCR and CT abdomen.  I am signing the patient out to the oncoming physician Dr. Alfred Levins.   ____________________________________________   FINAL CLINICAL IMPRESSION(S) / ED  DIAGNOSES  Final diagnoses:  None      NEW MEDICATIONS STARTED DURING THIS VISIT:  New Prescriptions   No medications on file     Note:  This document was prepared using Dragon voice recognition software and may include unintentional dictation errors.    Arta Silence, MD 04/07/18 [phone]

## 2018-04-30 ENCOUNTER — Encounter: Payer: Self-pay | Admitting: Emergency Medicine

## 2018-04-30 ENCOUNTER — Emergency Department
Admission: EM | Admit: 2018-04-30 | Discharge: 2018-04-30 | Disposition: A | Discharge status: Home / Self Care | Payer: Medicaid Other | Attending: Emergency Medicine | Admitting: Emergency Medicine

## 2018-04-30 ENCOUNTER — Other Ambulatory Visit: Payer: Self-pay

## 2018-04-30 DIAGNOSIS — R197 Diarrhea, unspecified: Secondary | ICD-10-CM

## 2018-04-30 DIAGNOSIS — Z7982 Long term (current) use of aspirin: Secondary | ICD-10-CM | POA: Diagnosis not present

## 2018-04-30 DIAGNOSIS — K529 Noninfective gastroenteritis and colitis, unspecified: Secondary | ICD-10-CM

## 2018-04-30 DIAGNOSIS — Z79899 Other long term (current) drug therapy: Secondary | ICD-10-CM | POA: Diagnosis not present

## 2018-04-30 DIAGNOSIS — Z942 Lung transplant status: Secondary | ICD-10-CM | POA: Insufficient documentation

## 2018-04-30 DIAGNOSIS — F17210 Nicotine dependence, cigarettes, uncomplicated: Secondary | ICD-10-CM | POA: Insufficient documentation

## 2018-04-30 DIAGNOSIS — J449 Chronic obstructive pulmonary disease, unspecified: Secondary | ICD-10-CM | POA: Insufficient documentation

## 2018-04-30 HISTORY — DX: Noninfective gastroenteritis and colitis, unspecified: K52.9

## 2018-04-30 LAB — C DIFFICILE QUICK SCREEN W PCR REFLEX
C Diff antigen: POSITIVE — AB
C Diff toxin: NEGATIVE

## 2018-04-30 LAB — URINALYSIS, COMPLETE (UACMP) WITH MICROSCOPIC
Bilirubin Urine: NEGATIVE
Glucose, UA: NEGATIVE mg/dL
Ketones, ur: 5 mg/dL — AB
Leukocytes, UA: NEGATIVE
Nitrite: NEGATIVE
Protein, ur: NEGATIVE mg/dL
Specific Gravity, Urine: 1.021 (ref 1.005–1.030)
pH: 5 (ref 5.0–8.0)

## 2018-04-30 LAB — COMPREHENSIVE METABOLIC PANEL
ALT: 17 U/L (ref 0–44)
AST: 25 U/L (ref 15–41)
Albumin: 4.3 g/dL (ref 3.5–5.0)
Alkaline Phosphatase: 43 U/L (ref 38–126)
Anion gap: 7 (ref 5–15)
BUN: 19 mg/dL (ref 6–20)
CO2: 28 mmol/L (ref 22–32)
Calcium: 9.1 mg/dL (ref 8.9–10.3)
Chloride: 104 mmol/L (ref 98–111)
Creatinine, Ser: 1.13 mg/dL — ABNORMAL HIGH (ref 0.44–1.00)
GFR calc Af Amer: >60 mL/min (ref >60)
GFR calc non Af Amer: 53 mL/min — ABNORMAL LOW (ref >60)
Glucose, Bld: 141 mg/dL — ABNORMAL HIGH (ref 70–99)
Potassium: 3.6 mmol/L (ref 3.5–5.1)
Sodium: 139 mmol/L (ref 135–145)
Total Bilirubin: 1 mg/dL (ref 0.3–1.2)
Total Protein: 6.7 g/dL (ref 6.5–8.1)

## 2018-04-30 LAB — CBC
HCT: 37.1 % (ref 35.0–47.0)
Hemoglobin: 13 g/dL (ref 12.0–16.0)
MCH: 31.9 pg (ref 26.0–34.0)
MCHC: 34.9 g/dL (ref 32.0–36.0)
MCV: 91.2 fL (ref 80.0–100.0)
Platelets: 110 10*3/uL — ABNORMAL LOW (ref 150–440)
RBC: 4.07 MIL/uL (ref 3.80–5.20)
RDW: 14.6 % — ABNORMAL HIGH (ref 11.5–14.5)
WBC: 7.3 10*3/uL (ref 3.6–11.0)

## 2018-04-30 LAB — CLOSTRIDIUM DIFFICILE BY PCR, REFLEXED: Toxigenic C. Difficile by PCR: NEGATIVE

## 2018-04-30 LAB — LIPASE, BLOOD: Lipase: 25 U/L (ref 11–51)

## 2018-04-30 MED ORDER — METRONIDAZOLE 500 MG PO TABS: 500.0000 mg | ORAL_TABLET | Freq: Two times a day (BID) | ORAL | 0 refills | Status: DC

## 2018-04-30 MED ORDER — SODIUM CHLORIDE 0.9 % IV SOLN
1000.0000 mL | Freq: Once | INTRAVENOUS | Status: AC
  Administered 2018-04-30: 1000 mL via INTRAVENOUS

## 2018-04-30 MED ORDER — AMOXICILLIN-POT CLAVULANATE 875-125 MG PO TABS
1.0000 | ORAL_TABLET | Freq: Once | ORAL | Status: AC
  Administered 2018-04-30: 1 via ORAL
  Filled 2018-04-30 (×2): qty 1

## 2018-04-30 MED ORDER — METRONIDAZOLE 500 MG PO TABS
500.0000 mg | ORAL_TABLET | Freq: Once | ORAL | Status: AC
  Administered 2018-04-30: 500 mg via ORAL
  Filled 2018-04-30: qty 1

## 2018-04-30 MED ORDER — AMOXICILLIN-POT CLAVULANATE 875-125 MG PO TABS: 1.0000 | ORAL_TABLET | Freq: Two times a day (BID) | ORAL | 0 refills | Status: AC

## 2018-04-30 NOTE — ED Notes (Signed)
Pt attempting to obtain urine specimen at this time.

## 2018-04-30 NOTE — ED Notes (Signed)
Stool sent to the lab. Fluids continue to infuse.

## 2018-04-30 NOTE — ED Provider Notes (Signed)
[facility] Emergency Department Provider Note   ____________________________________________    I have reviewed the triage vital signs and the nursing notes.   HISTORY  Chief Complaint Abdominal Pain and Diarrhea     HPI Katrina White is a 57 y.o. female who presents with lower abdominal cramping as well as diarrhea.  Diarrhea is intermittently containing mucus and some blood.  She reports it feels very similar to nearly a month ago when she was diagnosed with colitis.  She reports after that diagnosis she improved very rapidly with Flagyl and Augmentin p.o. however 2 to 3 days ago she began having diarrhea again.  Denies fevers or chills.  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  She has had C. difficile in the past, last month C. difficile test was negative   Past Medical History:  Diagnosis Date  . Cancer (HCC)    Skin  . Colitis   . COPD (chronic obstructive pulmonary disease) ([place])   . Emphysema lung ([place])   . 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 Inflammatory polyarthritis ([place]) 01/23/2018  . Diarrhea 01/23/2018  . S/P lung transplant ([place]) 08/23/2010    Past Surgical History:  Procedure Laterality Date  . LUNG TRANSPLANT, DOUBLE    . TUBAL LIGATION      Prior to Admission medications   Medication Sig Start Date End Date Taking? Authorizing Provider  amoxicillin-clavulanate (AUGMENTIN) 875-125 MG tablet Take 1 tablet by mouth 2 (two) times daily for 7 days. 04/30/18 05/07/18  Lavonia Drafts, MD  aspirin 81 MG chewable tablet Chew 81 mg by mouth daily.  05/17/11   [provider]  atorvastatin (LIPITOR) 40 MG tablet Take 40 mg by mouth daily. 11/02/17   [provider]  Biotin (BIOTIN MAXIMUM STRENGTH) 5 MG CAPS Take 1 capsule by mouth daily.     [provider]  calcium citrate-vitamin D (CITRACAL+D) 315-200  MG-UNIT tablet Take 1 tablet by mouth daily.  05/12/11   [provider]  escitalopram (LEXAPRO) 10 MG tablet Take 10 mg by mouth daily.  11/02/17   [provider]  magnesium oxide (MAG-OX) 400 MG tablet Take 400 mg by mouth daily.  12/23/17   [provider]  metroNIDAZOLE (FLAGYL) 500 MG tablet Take 1 tablet (500 mg total) by mouth 2 (two) times daily after a meal. 04/30/18   Lavonia Drafts, MD  mirtazapine (REMERON) 15 MG tablet Take 15 mg by mouth at bedtime. 11/02/17   [provider]  Multiple Vitamin (MULTI-VITAMINS) TABS Take 1 tablet by mouth daily.  09/29/10   [provider]  mycophenolate (CELLCEPT) 500 MG tablet Take 500 mg by mouth 2 (two) times daily.  10/16/17   [provider]  ondansetron (ZOFRAN ODT) 4 MG disintegrating tablet Take 1 tablet (4 mg total) by mouth every 8 (eight) hours as needed for nausea or vomiting. 04/07/18   Alfred Levins, [place], MD  predniSONE (STERAPRED UNI-PAK 21 TAB) 10 MG (21) TBPK tablet 6 day taper - take by mouth as directed for 6 days Patient taking differently: 5 mg daily.  12/30/17   Ronnell Freshwater, NP  sulfamethoxazole-trimethoprim (BACTRIM,SEPTRA) 400-80 MG tablet Take 1.5 tablets by mouth daily.  10/31/17   [provider]  tacrolimus (PROGRAF) 1 MG capsule Take 2 mg by mouth 2 (two) times daily. 12/28/17   [provider]  thiamine 100 MG tablet Take 100 mg by mouth daily.  10/13/15   [provider]     Allergies Nsaids  Family History  Problem Relation Age of Onset  . Breast cancer Neg Hx     Social History Social History   Tobacco Use  . Smoking status: Current Every Day Smoker    Types: Cigarettes  . Smokeless tobacco: Never Used  . Tobacco comment: less than a pack a week plan to quit for mothers day  Substance Use Topics  . Alcohol use: Yes    Comment: socially (3-4drinks a week)  . Drug use: Never    Review of Systems  Constitutional: No  fever/chills Eyes: No visual changes.  ENT: No sore throat. Cardiovascular: Denies chest pain. Respiratory: Denies shortness of breath. Gastrointestinal: As above Genitourinary: Negative for dysuria. Musculoskeletal: Negative for back pain. Skin: Negative for rash. Neurological: Negative for headaches    ____________________________________________   PHYSICAL EXAM:  VITAL SIGNS: ED Triage Vitals  Enc Vitals Group     BP 04/30/18 1634 120/73     Pulse Rate 04/30/18 1634 (!) 103     Resp 04/30/18 1634 14     Temp 04/30/18 1634 98.1 F (36.7 C)     Temp Source 04/30/18 1634 Oral     SpO2 04/30/18 1634 100 %     Weight 04/30/18 1635 45.4 kg (100 lb)     Height 04/30/18 1635 1.626 m (5\' 4" )     Head Circumference --      Peak Flow --      Pain Score 04/30/18 1635 2     Pain Loc --      Pain Edu? --      Excl. in [place]? --     Constitutional: Alert and oriented.  Nose: No congestion/rhinnorhea. Mouth/Throat: Mucous membranes are moist.   Cardiovascular: Normal rate, regular rhythm. Grossly normal heart sounds.  Good peripheral circulation. Respiratory: Normal respiratory effort.  No retractions.  Gastrointestinal: As above Genitourinary: deferred Musculoskeletal: No lower extremity tenderness nor edema.  Warm and well perfused Neurologic:  Normal speech and language. No gross focal neurologic deficits are appreciated.  Skin:  Skin is warm, dry and intact. No rash noted. Psychiatric: Mood and affect are normal. Speech and behavior are normal.  ____________________________________________   LABS (all labs ordered are listed, but only abnormal results are displayed)  Labs Reviewed  COMPREHENSIVE METABOLIC PANEL - Abnormal; Notable for the following components:      Result Value   Glucose, Bld 141 (*)    Creatinine, Ser 1.13 (*)    GFR calc non Af Amer 53 (*)    All other components within normal limits  CBC - Abnormal; Notable for the following components:   RDW  14.6 (*)    Platelets 110 (*)    All other components within normal limits  URINALYSIS, COMPLETE (UACMP) WITH MICROSCOPIC - Abnormal; Notable for the following components:   Color, Urine YELLOW (*)    APPearance HAZY (*)    Hgb urine dipstick MODERATE (*)    Ketones, ur 5 (*)    Bacteria, UA RARE (*)    All other components within normal limits  GASTROINTESTINAL PANEL BY PCR, STOOL (REPLACES STOOL CULTURE)  C DIFFICILE QUICK SCREEN W PCR REFLEX  LIPASE, BLOOD   ____________________________________________  EKG  None ____________________________________________  RADIOLOGY  None ____________________________________________   PROCEDURES  Procedure(s) performed: No  Procedures   Critical Care performed: No ____________________________________________   INITIAL  IMPRESSION / ASSESSMENT AND PLAN / ED COURSE  Pertinent labs & imaging results that were available during my care of the patient were reviewed by me and considered in my medical decision making (see chart for details).  Patient presents with similar symptoms of colitis that she had 3 to 4 weeks ago.  At that time she was C. difficile negative, successfully treated with Flagyl and Augmentin.  Lab work is overall reassuring.  She reports compliance with her immunosuppressants.  Will attempt to get GI panel and C. difficile panel.  We will give IV fluids.  Overall her exam is reassuring.  ----------------------------------------- 8:01 PM on 04/30/2018 -----------------------------------------  Patient observed in the ED, she remains quite comfortable, has received IV fluids.  Has been able to give [place] a sample of diarrhea.  She would like to go home I think this is reasonable on reexam no abdominal tenderness to palpation.  We will start her on Augmentin and Flagyl, will follow up on stool studies and contact the patient    ____________________________________________   FINAL CLINICAL IMPRESSION(S) / ED  DIAGNOSES  Final diagnoses:  Diarrhea, unspecified type  Colitis        Note:  This document was prepared using Dragon voice recognition software and may include unintentional dictation errors.    Lavonia Drafts, MD 04/30/18 2002

## 2018-04-30 NOTE — ED Triage Notes (Signed)
Pt arrives via POV from home with complaints of low mid-abdominal pain x 3 days. Pt was here 8/16 and dx with colitis at that time. Pt states the pain is worse this time. Diarrhea x 3 days. Vomiting x 2 last 24 hours. Denies current nausea.  Pt has not had a chance to follow up with GI at this point.

## 2018-05-01 LAB — GASTROINTESTINAL PANEL BY PCR, STOOL (REPLACES STOOL CULTURE)

## 2018-06-15 ENCOUNTER — Other Ambulatory Visit: Payer: Self-pay | Admitting: Nurse Practitioner

## 2018-06-15 DIAGNOSIS — M064 Inflammatory polyarthropathy: Secondary | ICD-10-CM

## 2018-07-04 ENCOUNTER — Other Ambulatory Visit: Payer: Self-pay | Admitting: Nurse Practitioner

## 2018-07-04 DIAGNOSIS — Z1231 Encounter for screening mammogram for malignant neoplasm of breast: Secondary | ICD-10-CM

## 2018-07-06 ENCOUNTER — Encounter: Payer: Self-pay | Admitting: Nurse Practitioner

## 2018-07-06 ENCOUNTER — Ambulatory Visit: Payer: Medicaid Other | Admitting: Nurse Practitioner

## 2018-07-06 VITALS — BP 129/69 | HR 69 | Resp 16 | Ht 64.0 in | Wt 102.0 lb

## 2018-07-06 DIAGNOSIS — R197 Diarrhea, unspecified: Secondary | ICD-10-CM

## 2018-07-06 DIAGNOSIS — R300 Dysuria: Secondary | ICD-10-CM | POA: Diagnosis not present

## 2018-07-06 DIAGNOSIS — Z0001 Encounter for general adult medical examination with abnormal findings: Secondary | ICD-10-CM

## 2018-07-06 DIAGNOSIS — Z942 Lung transplant status: Secondary | ICD-10-CM | POA: Diagnosis not present

## 2018-07-06 NOTE — Progress Notes (Signed)
[facility] [place], [place] [address]  Internal MEDICINE  Office Visit Note  Patient Name: Katrina White  [id number]  [id number]  Date of Service: 07/06/2018   Pt is here for routine health maintenance examination  Chief Complaint  Patient presents with  . Annual Exam    6 month well visit     The patient is here for annual health maintenance exam. She has had three episodes of colitis in the recent past. Has just finished her last round of augmentin and flagyl. Is feeling better. She is set up to see GI specialist at [facility]. She is lung transplant patient. Has history of C. Diff in the past. This was negative .She was diagnosed with colitis.     Current Medication: Outpatient Encounter Medications as of 07/06/2018  Medication Sig  . aspirin 81 MG chewable tablet Chew 81 mg by mouth daily.   Marland Kitchen atorvastatin (LIPITOR) 40 MG tablet Take 40 mg by mouth daily.  . Biotin (BIOTIN MAXIMUM STRENGTH) 5 MG CAPS Take 1 capsule by mouth daily.   . calcium citrate-vitamin D (CITRACAL+D) 315-200 MG-UNIT tablet Take 1 tablet by mouth daily.   Marland Kitchen escitalopram (LEXAPRO) 10 MG tablet Take 10 mg by mouth daily.   . magnesium oxide (MAG-OX) 400 MG tablet Take 400 mg by mouth daily.   . mirtazapine (REMERON) 15 MG tablet Take 15 mg by mouth at bedtime.  . Multiple Vitamin (MULTI-VITAMINS) TABS Take 1 tablet by mouth daily.   . mycophenolate (CELLCEPT) 500 MG tablet Take 500 mg by mouth 2 (two) times daily.   . ondansetron (ZOFRAN ODT) 4 MG disintegrating tablet Take 1 tablet (4 mg total) by mouth every 8 (eight) hours as needed for nausea or vomiting.  . predniSONE (STERAPRED UNI-PAK 21 TAB) 10 MG (21) TBPK tablet 6 day taper - take by mouth as directed for 6 days (Patient taking differently: 5 mg daily. )  . sulfamethoxazole-trimethoprim (BACTRIM,SEPTRA) 400-80 MG tablet Take 1.5 tablets by mouth daily.   . tacrolimus (PROGRAF) 1 MG capsule Take 2 mg by mouth 2 (two) times  daily.  Marland Kitchen thiamine 100 MG tablet Take 100 mg by mouth daily.   . metroNIDAZOLE (FLAGYL) 500 MG tablet Take 1 tablet (500 mg total) by mouth 2 (two) times daily after a meal. (Patient not taking: Reported on 07/06/2018)   No facility-administered encounter medications on file as of 07/06/2018.     Surgical History: Past Surgical History:  Procedure Laterality Date  . LUNG TRANSPLANT, DOUBLE    . TUBAL LIGATION      Medical History: Past Medical History:  Diagnosis Date  . Cancer (HCC)    Skin  . Colitis   . COPD (chronic obstructive pulmonary disease) ([place])   . Emphysema lung ([place])   . 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 Breast cancer Neg Hx       Review of Systems  Constitutional: Negative for activity change, chills, fatigue and unexpected weight change.  HENT: Negative for congestion, postnasal drip, rhinorrhea, sneezing and sore throat.   Respiratory: Negative for cough, chest tightness, shortness of breath and wheezing.   Cardiovascular: Negative for chest pain and palpitations.  Gastrointestinal: Positive for diarrhea. Negative for abdominal pain, constipation, nausea and vomiting.       Improved. Just finished round of flagyl/augmentin due to bacterial colitis.   Endocrine: Negative for cold intolerance, heat  intolerance, polydipsia, polyphagia and polyuria.  Genitourinary: Negative.  Negative for dysuria and frequency.  Musculoskeletal: Negative for arthralgias, back pain, joint swelling and neck pain.  Skin: Negative for rash.  Allergic/Immunologic: Negative for environmental allergies.  Neurological: Negative for dizziness, tremors, numbness and headaches.  Hematological: Negative for adenopathy. Does not bruise/bleed easily.  Psychiatric/Behavioral: Negative for behavioral problems (Depression), dysphoric mood, sleep disturbance and suicidal ideas. The patient is not nervous/anxious.       Today's Vitals   07/06/18 0908  BP: 129/69  Pulse: 69  Resp: 16  SpO2: 98%  Weight: 102 lb (46.3 kg)  Height: 5' 4"  (1.626 m)    Physical Exam  Constitutional: She is oriented to person, place, and time. She appears well-developed and well-nourished. No distress.  HENT:  Head: Normocephalic and atraumatic.  Nose: Nose normal.  Mouth/Throat: Oropharynx is clear and moist. No oropharyngeal exudate.  Eyes: Pupils are equal, round, and reactive to light. Conjunctivae and EOM are normal.  Neck: Normal range of motion. Neck supple. No JVD present. Carotid bruit is not present. No tracheal deviation present. No thyromegaly present.  Cardiovascular: Normal rate, regular rhythm, normal heart sounds and intact distal pulses. Exam reveals no gallop and no friction rub.  No murmur heard. Pulmonary/Chest: Effort normal and breath sounds normal. No respiratory distress. She has no wheezes. She has no rales. She exhibits no tenderness. Right breast exhibits no inverted nipple, no mass, no nipple discharge, no skin change and no tenderness. Left breast exhibits no inverted nipple, no mass, no nipple discharge, no skin change and no tenderness.  Abdominal: Soft. Bowel sounds are normal. There is no tenderness.  Musculoskeletal: Normal range of motion.  Lymphadenopathy:    She has no cervical adenopathy.  Neurological: She is alert and oriented to person, place, and time. No cranial nerve deficit.  Skin: Skin is warm and dry. She is not diaphoretic.  Psychiatric: She has a normal mood and affect. Her behavior is normal. Judgment and thought content normal.  Nursing note and vitals reviewed.  Depression screen [facility] 2/9 07/06/2018 12/30/2017  Decreased Interest 0 0  Down, Depressed, Hopeless 0 0  PHQ - 2 Score 0 0    Functional Status Survey: Is the patient deaf or have difficulty hearing?: No Does the patient have difficulty seeing, even when wearing glasses/contacts?: No Does the patient  have difficulty concentrating, remembering, or making decisions?: No Does the patient have difficulty walking or climbing stairs?: No Does the patient have difficulty dressing or bathing?: No Does the patient have difficulty doing errands alone such as visiting a doctor's office or shopping?: No  MMSE - [place] Exam 07/06/2018  Orientation to time 5  Orientation to Place 5  Registration 3  Attention/ Calculation 5  Recall 3  Language- name 2 objects 2  Language- repeat 1  Language- follow 3 step command 3  Language- read & follow direction 1  Write a sentence 1  Copy design 1  Total score 30    Fall Risk  07/06/2018 12/30/2017  Falls in the past year? 0 No     LABS: Recent Results (from the past 2160 hour(s))  Lipase, blood     Status: None   Collection Time: 04/07/18 12:40 PM  Result Value Ref Range   Lipase 33 11 - 51 U/L    Comment: Performed at [facility], [address] [place], [place] [address]  Comprehensive metabolic panel     Status: Abnormal   Collection  Time: 04/07/18 12:40 PM  Result Value Ref Range   Sodium 141 135 - 145 mmol/L   Potassium 3.6 3.5 - 5.1 mmol/L   Chloride 106 98 - 111 mmol/L   CO2 28 22 - 32 mmol/L   Glucose, Bld 125 (H) 70 - 99 mg/dL   BUN 29 (H) 6 - 20 mg/dL   Creatinine, Ser 1.21 (H) 0.44 - 1.00 mg/dL   Calcium 9.2 8.9 - 10.3 mg/dL   Total Protein 6.8 6.5 - 8.1 g/dL   Albumin 4.3 3.5 - 5.0 g/dL   AST 28 15 - 41 U/L   ALT 17 0 - 44 U/L   Alkaline Phosphatase 43 38 - 126 U/L   Total Bilirubin 1.0 0.3 - 1.2 mg/dL   GFR calc non Af Amer 48 (L) >60 mL/min   GFR calc Af Amer 56 (L) >60 mL/min    Comment: (NOTE) The eGFR has been calculated using the CKD EPI equation. This calculation has not been validated in all clinical situations. eGFR's persistently <60 mL/min signify possible Chronic Kidney Disease.    Anion gap 7 5 - 15    Comment: Performed at [facility], [address] [place], [place]  [address]  CBC     Status: Abnormal   Collection Time: 04/07/18 12:40 PM  Result Value Ref Range   WBC 8.7 3.6 - 11.0 K/uL   RBC 4.19 3.80 - 5.20 MIL/uL   Hemoglobin 13.2 12.0 - 16.0 g/dL   HCT 38.5 35.0 - 47.0 %   MCV 91.8 80.0 - 100.0 fL   MCH 31.5 26.0 - 34.0 pg   MCHC 34.2 32.0 - 36.0 g/dL   RDW 14.2 11.5 - 14.5 %   Platelets 105 (L) 150 - 440 K/uL    Comment: Performed at [facility], [address] [place], [place] [address]  Urinalysis, Complete w Microscopic     Status: Abnormal   Collection Time: 04/07/18 12:40 PM  Result Value Ref Range   Color, Urine YELLOW (A) YELLOW   APPearance CLEAR (A) CLEAR   Specific Gravity, Urine 1.018 1.005 - 1.030   pH 5.0 5.0 - 8.0   Glucose, UA NEGATIVE NEGATIVE mg/dL   Hgb urine dipstick MODERATE (A) NEGATIVE   Bilirubin Urine NEGATIVE NEGATIVE   Ketones, ur NEGATIVE NEGATIVE mg/dL   Protein, ur NEGATIVE NEGATIVE mg/dL   Nitrite NEGATIVE NEGATIVE   Leukocytes, UA NEGATIVE NEGATIVE   RBC / HPF 0-5 0 - 5 RBC/hpf   WBC, UA 0-5 0 - 5 WBC/hpf   Bacteria, UA RARE (A) NONE SEEN   Squamous Epithelial / LPF 0-5 0 - 5   Mucus PRESENT    Hyaline Casts, UA PRESENT     Comment: Performed at [facility], [address] [place], [place] [address]  C difficile quick scan w PCR reflex     Status: None   Collection Time: 04/07/18  3:04 PM  Result Value Ref Range   C Diff antigen NEGATIVE NEGATIVE   C Diff toxin NEGATIVE NEGATIVE   C Diff interpretation No C. difficile detected.     Comment: Performed at [facility], [address] [place], [place] [address]  Gastrointestinal Panel by PCR , Stool     Status: None   Collection Time: 04/07/18  3:04 PM  Result Value Ref Range   Campylobacter species NOT DETECTED NOT DETECTED   Plesimonas shigelloides NOT DETECTED NOT DETECTED   Salmonella species NOT DETECTED NOT DETECTED  Yersinia enterocolitica NOT DETECTED NOT DETECTED   Vibrio species NOT DETECTED NOT DETECTED    Vibrio cholerae NOT DETECTED NOT DETECTED   Enteroaggregative E coli (EAEC) NOT DETECTED NOT DETECTED   Enteropathogenic E coli (EPEC) NOT DETECTED NOT DETECTED   Enterotoxigenic E coli (ETEC) NOT DETECTED NOT DETECTED   Shiga like toxin producing E coli (STEC) NOT DETECTED NOT DETECTED   Shigella/Enteroinvasive E coli (EIEC) NOT DETECTED NOT DETECTED   Cryptosporidium NOT DETECTED NOT DETECTED   Cyclospora cayetanensis NOT DETECTED NOT DETECTED   Entamoeba histolytica NOT DETECTED NOT DETECTED   Giardia lamblia NOT DETECTED NOT DETECTED   Adenovirus F40/41 NOT DETECTED NOT DETECTED   Astrovirus NOT DETECTED NOT DETECTED   Norovirus GI/GII NOT DETECTED NOT DETECTED   Rotavirus A NOT DETECTED NOT DETECTED   Sapovirus (I, II, IV, and V) NOT DETECTED NOT DETECTED    Comment: Performed at [facility], [address] [place], [place] [address]  Lipase, blood     Status: None   Collection Time: 04/30/18  4:43 PM  Result Value Ref Range   Lipase 25 11 - 51 U/L    Comment: Performed at [facility], [address] [place], [place] [address]  Comprehensive metabolic panel     Status: Abnormal   Collection Time: 04/30/18  4:43 PM  Result Value Ref Range   Sodium 139 135 - 145 mmol/L   Potassium 3.6 3.5 - 5.1 mmol/L   Chloride 104 98 - 111 mmol/L   CO2 28 22 - 32 mmol/L   Glucose, Bld 141 (H) 70 - 99 mg/dL   BUN 19 6 - 20 mg/dL   Creatinine, Ser 1.13 (H) 0.44 - 1.00 mg/dL   Calcium 9.1 8.9 - 10.3 mg/dL   Total Protein 6.7 6.5 - 8.1 g/dL   Albumin 4.3 3.5 - 5.0 g/dL   AST 25 15 - 41 U/L   ALT 17 0 - 44 U/L   Alkaline Phosphatase 43 38 - 126 U/L   Total Bilirubin 1.0 0.3 - 1.2 mg/dL   GFR calc non Af Amer 53 (L) >60 mL/min   GFR calc Af Amer >60 >60 mL/min    Comment: (NOTE) The eGFR has been calculated using the CKD EPI equation. This calculation has not been validated in all clinical situations. eGFR's persistently <60 mL/min signify possible Chronic  Kidney Disease.    Anion gap 7 5 - 15    Comment: Performed at [facility], [address] [place], [place] [address]  CBC     Status: Abnormal   Collection Time: 04/30/18  4:43 PM  Result Value Ref Range   WBC 7.3 3.6 - 11.0 K/uL   RBC 4.07 3.80 - 5.20 MIL/uL   Hemoglobin 13.0 12.0 - 16.0 g/dL   HCT 37.1 35.0 - 47.0 %   MCV 91.2 80.0 - 100.0 fL   MCH 31.9 26.0 - 34.0 pg   MCHC 34.9 32.0 - 36.0 g/dL   RDW 14.6 (H) 11.5 - 14.5 %   Platelets 110 (L) 150 - 440 K/uL    Comment: Performed at [facility], [address] [place], [place] [address]  Urinalysis, Complete w Microscopic     Status: Abnormal   Collection Time: 04/30/18  4:43 PM  Result Value Ref Range   Color, Urine YELLOW (A) YELLOW   APPearance HAZY (A) CLEAR   Specific Gravity, Urine 1.021 1.005 - 1.030   pH 5.0 5.0 - 8.0  Glucose, UA NEGATIVE NEGATIVE mg/dL   Hgb urine dipstick MODERATE (A) NEGATIVE   Bilirubin Urine NEGATIVE NEGATIVE   Ketones, ur 5 (A) NEGATIVE mg/dL   Protein, ur NEGATIVE NEGATIVE mg/dL   Nitrite NEGATIVE NEGATIVE   Leukocytes, UA NEGATIVE NEGATIVE   RBC / HPF 6-10 0 - 5 RBC/hpf   WBC, UA 0-5 0 - 5 WBC/hpf   Bacteria, UA RARE (A) NONE SEEN   Squamous Epithelial / LPF 6-10 0 - 5   Mucus PRESENT     Comment: Performed at [facility], [address] [place], [place] [address]  Gastrointestinal Panel by PCR , Stool     Status: None   Collection Time: 04/30/18  8:05 PM  Result Value Ref Range   Campylobacter species NOT DETECTED NOT DETECTED   Plesimonas shigelloides NOT DETECTED NOT DETECTED   Salmonella species NOT DETECTED NOT DETECTED   Yersinia enterocolitica NOT DETECTED NOT DETECTED   Vibrio species NOT DETECTED NOT DETECTED   Vibrio cholerae NOT DETECTED NOT DETECTED   Enteroaggregative E coli (EAEC) NOT DETECTED NOT DETECTED   Enteropathogenic E coli (EPEC) NOT DETECTED NOT DETECTED   Enterotoxigenic E coli (ETEC) NOT DETECTED NOT DETECTED   Shiga  like toxin producing E coli (STEC) NOT DETECTED NOT DETECTED   Shigella/Enteroinvasive E coli (EIEC) NOT DETECTED NOT DETECTED   Cryptosporidium NOT DETECTED NOT DETECTED   Cyclospora cayetanensis NOT DETECTED NOT DETECTED   Entamoeba histolytica NOT DETECTED NOT DETECTED   Giardia lamblia NOT DETECTED NOT DETECTED   Adenovirus F40/41 NOT DETECTED NOT DETECTED   Astrovirus NOT DETECTED NOT DETECTED   Norovirus GI/GII NOT DETECTED NOT DETECTED   Rotavirus A NOT DETECTED NOT DETECTED   Sapovirus (I, II, IV, and V) NOT DETECTED NOT DETECTED    Comment: Performed at [facility], [address] [place], [place] [address]  C difficile quick scan w PCR reflex     Status: Abnormal   Collection Time: 04/30/18  8:05 PM  Result Value Ref Range   C Diff antigen POSITIVE (A) NEGATIVE   C Diff toxin NEGATIVE NEGATIVE   C Diff interpretation Results are indeterminate. See PCR results.     Comment: Performed at [facility], [address] [place], [place] [address]  C. Diff by PCR, Reflexed     Status: None   Collection Time: 04/30/18  8:05 PM  Result Value Ref Range   Toxigenic C. Difficile by PCR NEGATIVE NEGATIVE    Comment: Patient is colonized with non toxigenic C. difficile. May not need treatment unless significant symptoms are present. Performed at [facility], [address] [place], [place] [address]     Assessment/Plan:  1. Encounter for general adult medical examination with abnormal findings Annual health maintenance exam today  2. Diarrhea, unspecified type Improved. Recently finished round of augmentin and flagyl. Will see GI provider at [facility] in upcoming weeks for further evaluation and treatment.   3. S/P lung transplant ([place]) Continues to see lung transplant center at [facility] for regular evaluation.   4. Dysuria - UA/M w/rflx Culture, Routine  General Counseling: Teffany verbalizes understanding of the findings of todays visit and agrees with  plan of treatment. I have discussed any further diagnostic evaluation that may be needed or ordered today. We also reviewed her medications today. she has been encouraged to call the office with any questions or concerns that should arise related to todays visit.    Counseling:  This patient was  seen by Leretha Pol FNP Collaboration with Dr Lavera Guise as a part of collaborative care agreement  Orders Placed This Encounter  Procedures  . UA/M w/rflx Culture, Routine      Time spent: [place], MD  Internal Medicine

## 2018-07-07 LAB — MICROSCOPIC EXAMINATION
Casts: NONE SEEN /lpf
Epithelial Cells (non renal): NONE SEEN /hpf (ref 0–10)
WBC, UA: NONE SEEN /hpf (ref 0–5)

## 2018-07-07 LAB — UA/M W/RFLX CULTURE, ROUTINE
Bilirubin, UA: NEGATIVE
Glucose, UA: NEGATIVE
Ketones, UA: NEGATIVE
Leukocytes, UA: NEGATIVE
Nitrite, UA: NEGATIVE
Protein, UA: NEGATIVE
RBC, UA: NEGATIVE
Specific Gravity, UA: 1.005 (ref 1.005–1.030)
Urobilinogen, Ur: 0.2 mg/dL (ref 0.2–1.0)
pH, UA: 5.5 (ref 5.0–7.5)

## 2018-09-05 ENCOUNTER — Emergency Department
Admission: EM | Admit: 2018-09-05 | Discharge: 2018-09-06 | Disposition: A | Discharge status: Other Acute Inpatient Hospital | Payer: Medicaid Other | Attending: Emergency Medicine | Admitting: Emergency Medicine

## 2018-09-05 ENCOUNTER — Other Ambulatory Visit: Payer: Self-pay

## 2018-09-05 ENCOUNTER — Encounter: Payer: Self-pay | Admitting: Emergency Medicine

## 2018-09-05 DIAGNOSIS — N183 Chronic kidney disease, stage 3 (moderate): Secondary | ICD-10-CM | POA: Diagnosis not present

## 2018-09-05 DIAGNOSIS — R197 Diarrhea, unspecified: Secondary | ICD-10-CM | POA: Diagnosis present

## 2018-09-05 DIAGNOSIS — Z940 Kidney transplant status: Secondary | ICD-10-CM | POA: Diagnosis not present

## 2018-09-05 DIAGNOSIS — F32A Depression, unspecified: Secondary | ICD-10-CM

## 2018-09-05 DIAGNOSIS — F329 Major depressive disorder, single episode, unspecified: Secondary | ICD-10-CM | POA: Diagnosis not present

## 2018-09-05 DIAGNOSIS — Z85820 Personal history of malignant melanoma of skin: Secondary | ICD-10-CM | POA: Diagnosis not present

## 2018-09-05 DIAGNOSIS — F17210 Nicotine dependence, cigarettes, uncomplicated: Secondary | ICD-10-CM | POA: Diagnosis not present

## 2018-09-05 DIAGNOSIS — K529 Noninfective gastroenteritis and colitis, unspecified: Secondary | ICD-10-CM | POA: Diagnosis not present

## 2018-09-05 DIAGNOSIS — Z7982 Long term (current) use of aspirin: Secondary | ICD-10-CM | POA: Insufficient documentation

## 2018-09-05 DIAGNOSIS — Z79899 Other long term (current) drug therapy: Secondary | ICD-10-CM | POA: Diagnosis not present

## 2018-09-05 DIAGNOSIS — N390 Urinary tract infection, site not specified: Secondary | ICD-10-CM

## 2018-09-05 DIAGNOSIS — J449 Chronic obstructive pulmonary disease, unspecified: Secondary | ICD-10-CM | POA: Insufficient documentation

## 2018-09-05 LAB — COMPREHENSIVE METABOLIC PANEL
ALT: 13 U/L (ref 0–44)
AST: 21 U/L (ref 15–41)
Albumin: 4.7 g/dL (ref 3.5–5.0)
Alkaline Phosphatase: 62 U/L (ref 38–126)
Anion gap: 10 (ref 5–15)
BUN: 22 mg/dL — ABNORMAL HIGH (ref 6–20)
CO2: 27 mmol/L (ref 22–32)
Calcium: 9.8 mg/dL (ref 8.9–10.3)
Chloride: 101 mmol/L (ref 98–111)
Creatinine, Ser: 1.17 mg/dL — ABNORMAL HIGH (ref 0.44–1.00)
GFR calc Af Amer: 59 mL/min — ABNORMAL LOW (ref >60)
GFR calc non Af Amer: 51 mL/min — ABNORMAL LOW (ref >60)
Glucose, Bld: 115 mg/dL — ABNORMAL HIGH (ref 70–99)
Potassium: 3.6 mmol/L (ref 3.5–5.1)
Sodium: 138 mmol/L (ref 135–145)
Total Bilirubin: 1.2 mg/dL (ref 0.3–1.2)
Total Protein: 7.5 g/dL (ref 6.5–8.1)

## 2018-09-05 LAB — CBC
HCT: 42.9 % (ref 36.0–46.0)
Hemoglobin: 13.9 g/dL (ref 12.0–15.0)
MCH: 30.6 pg (ref 26.0–34.0)
MCHC: 32.4 g/dL (ref 30.0–36.0)
MCV: 94.5 fL (ref 80.0–100.0)
Platelets: 107 10*3/uL — ABNORMAL LOW (ref 150–400)
RBC: 4.54 MIL/uL (ref 3.87–5.11)
RDW: 13.5 % (ref 11.5–15.5)
WBC: 7.9 10*3/uL (ref 4.0–10.5)
nRBC: 0 % (ref 0.0–0.2)

## 2018-09-05 LAB — URINALYSIS, COMPLETE (UACMP) WITH MICROSCOPIC
Bilirubin Urine: NEGATIVE
Glucose, UA: NEGATIVE mg/dL
Ketones, ur: 20 mg/dL — AB
Nitrite: POSITIVE — AB
Protein, ur: 30 mg/dL — AB
Specific Gravity, Urine: 1.025 (ref 1.005–1.030)
Squamous Epithelial / LPF: >50 — ABNORMAL HIGH (ref 0–5)
WBC, UA: >50 WBC/hpf — ABNORMAL HIGH (ref 0–5)
pH: 5 (ref 5.0–8.0)

## 2018-09-05 LAB — LIPASE, BLOOD: Lipase: 22 U/L (ref 11–51)

## 2018-09-05 MED ORDER — SODIUM CHLORIDE 0.9 % IV SOLN
1000.0000 mL | Freq: Once | INTRAVENOUS | Status: AC
  Administered 2018-09-05: 1000 mL via INTRAVENOUS

## 2018-09-05 MED ORDER — ONDANSETRON HCL 4 MG/2ML IJ SOLN
4.0000 mg | Freq: Once | INTRAMUSCULAR | Status: AC | PRN
  Administered 2018-09-05: 4 mg via INTRAVENOUS
  Filled 2018-09-05: qty 2

## 2018-09-05 MED ORDER — ONDANSETRON 4 MG PO TBDP
4.0000 mg | ORAL_TABLET | Freq: Four times a day (QID) | ORAL | Status: DC | PRN
  Filled 2018-09-05 (×2): qty 1

## 2018-09-05 MED ORDER — TACROLIMUS 1 MG PO CAPS
2.0000 mg | ORAL_CAPSULE | Freq: Two times a day (BID) | ORAL | Status: DC
  Administered 2018-09-05: 1 mg via ORAL
  Administered 2018-09-06: 2 mg via ORAL
  Filled 2018-09-05 (×4): qty 2

## 2018-09-05 MED ORDER — FOSFOMYCIN TROMETHAMINE 3 G PO PACK
3.0000 g | PACK | Freq: Once | ORAL | Status: AC
  Administered 2018-09-05: 3 g via ORAL
  Filled 2018-09-05: qty 3

## 2018-09-05 MED ORDER — MYCOPHENOLATE MOFETIL 250 MG PO CAPS
500.0000 mg | ORAL_CAPSULE | Freq: Two times a day (BID) | ORAL | Status: DC
  Administered 2018-09-05 – 2018-09-06 (×2): 500 mg via ORAL
  Filled 2018-09-05 (×4): qty 2

## 2018-09-05 MED ORDER — MIRTAZAPINE 15 MG PO TABS
15.0000 mg | ORAL_TABLET | Freq: Every day | ORAL | Status: DC
  Administered 2018-09-05: 15 mg via ORAL
  Filled 2018-09-05: qty 1

## 2018-09-05 MED ORDER — CEPHALEXIN 500 MG PO CAPS
500.0000 mg | ORAL_CAPSULE | Freq: Two times a day (BID) | ORAL | Status: DC
  Administered 2018-09-05 – 2018-09-06 (×2): 500 mg via ORAL
  Filled 2018-09-05 (×3): qty 1

## 2018-09-05 MED ORDER — ATORVASTATIN CALCIUM 20 MG PO TABS
40.0000 mg | ORAL_TABLET | Freq: Every day | ORAL | Status: DC
  Administered 2018-09-05: 40 mg via ORAL
  Filled 2018-09-05 (×2): qty 2
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
## 2018-09-05 NOTE — ED Notes (Signed)
Hourly rounding reveals patient in room. No complaints, stable, in no acute distress. Q15 minute rounds and monitoring via Security Cameras to continue. 

## 2018-09-05 NOTE — ED Notes (Signed)
Hourly rounding reveals patient in room. No complaints, stable, in no acute distress. Q15 minute rounds and monitoring via Rover and Officer to continue.   

## 2018-09-05 NOTE — ED Notes (Signed)
Patient transferred from TCU to 20 after report from [facility].

## 2018-09-05 NOTE — ED Triage Notes (Addendum)
Patient reports nausea, vomiting and diarrhea x3 days. States she is unable to keep any food or drink down. Patient unsure of fever at home but reports having sweats. Patient reports history of colitis

## 2018-09-05 NOTE — ED Notes (Addendum)
Hourly rounding reveals patient in room. No complaints, stable, in no acute distress. Q15 minute rounds and monitoring via Rover and Officer to continue.   

## 2018-09-05 NOTE — ED Provider Notes (Signed)
[facility] Emergency Department Provider Note   ____________________________________________    I have reviewed the triage vital signs and the nursing notes.   HISTORY  Chief Complaint Diarrhea and Emesis     HPI Katrina White is a 58 y.o. female with history of COPD, chronic kidney disease status post lung transplant who reports compliance with her tacrolimus and CellCept who presents with primary complaint of depression.  She reports she recently was evicted and is now homeless.  She is tearful and describes that over the last 2 days she has had nausea vomiting and diarrhea which has just been too much for her.  She would like to speak to psychiatry, does not describe suicidal ideation   Past Medical History:  Diagnosis Date  . Cancer (HCC)    Skin  . Colitis   . COPD (chronic obstructive pulmonary disease) ([place])   . Emphysema lung ([place])   . 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 Dysuria 07/06/2018  . Inflammatory polyarthritis ([place]) 01/23/2018  . Diarrhea 01/23/2018  . CKD (chronic kidney disease) stage 3, GFR 30-59 ml/min (HCC) 10/09/2017  . Protein-calorie malnutrition ([place]) 10/09/2017  . Smoking 10/07/2017  . Personal history of malignant neoplasm of skin 08/30/2016  . Poor balance 11/14/2015  . MDD (major depressive disorder), recurrent, in partial remission ([place]) 10/11/2015  . History of atrial flutter 06/04/2015  . History of chronic kidney disease 06/04/2015  . Hypogammaglobulinemia, acquired ([place]) 08/06/2014  . Arthritis, senescent 03/06/2014  . Hyperlipidemia 12/01/2011  . High risk medications (not anticoagulants) long-term use 07/02/2011  . Encounter for general adult medical examination with abnormal findings 07/02/2011  . Immunosuppression ([place]) 06/04/2011  . 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 Lung transplant status, bilateral ([place]) 06/04/2011  .  S/P lung transplant ([place]) 08/23/2010    Past Surgical History:  Procedure Laterality Date  . LUNG TRANSPLANT, DOUBLE    . TUBAL LIGATION      Prior to Admission medications   Medication Sig Start Date End Date Taking? Authorizing Provider  aspirin 81 MG chewable tablet Chew 81 mg by mouth daily.  05/17/11   [provider]  atorvastatin (LIPITOR) 40 MG tablet Take 40 mg by mouth daily. 11/02/17   [provider]  Biotin (BIOTIN MAXIMUM STRENGTH) 5 MG CAPS Take 1 capsule by mouth daily.     [provider]  calcium citrate-vitamin D (CITRACAL+D) 315-200 MG-UNIT tablet Take 1 tablet by mouth daily.  05/12/11   [provider]  escitalopram (LEXAPRO) 10 MG tablet Take 10 mg by mouth daily.  11/02/17   [provider]  magnesium oxide (MAG-OX) 400 MG tablet Take 400 mg by mouth daily.  12/23/17   [provider]  metroNIDAZOLE (FLAGYL) 500 MG tablet Take 1 tablet (500 mg total) by mouth 2 (two) times daily after a meal. Patient not taking: Reported on 07/06/2018 04/30/18   Lavonia Drafts, MD  mirtazapine (REMERON) 15 MG tablet Take 15 mg by mouth at bedtime. 11/02/17   [provider]  Multiple Vitamin (MULTI-VITAMINS) TABS Take 1 tablet by mouth daily.  09/29/10   [provider]  mycophenolate (CELLCEPT) 500 MG tablet Take 500 mg by mouth 2 (two) times daily.  10/16/17   [provider]  ondansetron (ZOFRAN ODT) 4 MG disintegrating tablet Take 1 tablet (4 mg total) by mouth every 8 (eight) hours as needed for nausea  or vomiting. 04/07/18   Alfred Levins, [place], MD  predniSONE (STERAPRED UNI-PAK 21 TAB) 10 MG (21) TBPK tablet 6 day taper - take by mouth as directed for 6 days Patient taking differently: 5 mg daily.  12/30/17   Ronnell Freshwater, NP  sulfamethoxazole-trimethoprim (BACTRIM,SEPTRA) 400-80 MG tablet Take 1.5 tablets by mouth daily.  10/31/17   [provider]  tacrolimus (PROGRAF) 1 MG capsule Take 2 mg by  mouth 2 (two) times daily. 12/28/17   [provider]  thiamine 100 MG tablet Take 100 mg by mouth daily.  10/13/15   [provider]     Allergies Nsaids  Family History  Problem Relation Age of Onset  . Breast cancer Neg Hx     Social History Social History   Tobacco Use  . Smoking status: Current Every Day Smoker    Types: Cigarettes  . Smokeless tobacco: Never Used  . Tobacco comment: less than a pack a week plan to quit for mothers day  Substance Use Topics  . Alcohol use: Yes    Comment: socially (3-4drinks a week)  . Drug use: Never    Review of Systems  Constitutional: No fever/chills Eyes: No visual changes.  ENT: No sore throat. Cardiovascular: Denies chest pain. Respiratory: Denies shortness of breath. Gastrointestinal: As above Genitourinary: Mild dysuria Musculoskeletal: Negative for back pain. Skin: Negative for rash. Neurological: Negative for headaches or weakness   ____________________________________________   PHYSICAL EXAM:  VITAL SIGNS: ED Triage Vitals  Enc Vitals Group     BP 09/05/18 1815 (!) 133/91     Pulse Rate 09/05/18 1815 (!) 111     Resp 09/05/18 1815 18     Temp 09/05/18 1815 98.6 F (37 C)     Temp Source 09/05/18 1815 Oral     SpO2 09/05/18 1815 98 %     Weight 09/05/18 1816 44.5 kg (98 lb)     Height 09/05/18 1816 1.626 m (5\' 4" )     Head Circumference --      Peak Flow --      Pain Score 09/05/18 1816 4     Pain Loc --      Pain Edu? --      Excl. in [place]? --     Constitutional: Alert and oriented.  Tearful Eyes: Conjunctivae are normal.   Mouth/Throat: Mucous membranes are moist.    Cardiovascular: Normal rate, regular rhythm. Grossly normal heart sounds.  Good peripheral circulation. Respiratory: Normal respiratory effort.  No retractions. Lungs CTAB. Gastrointestinal: Soft and nontender. No distention.  No CVA tenderness.  Musculoskeletal: No lower extremity tenderness nor edema.  Warm and  well perfused Neurologic:  Normal speech and language. No gross focal neurologic deficits are appreciated.  Skin:  Skin is warm, dry and intact. No rash noted. Psychiatric: Mood and affect are normal. Speech and behavior are normal.  ____________________________________________   LABS (all labs ordered are listed, but only abnormal results are displayed)  Labs Reviewed  COMPREHENSIVE METABOLIC PANEL - Abnormal; Notable for the following components:      Result Value   Glucose, Bld 115 (*)    BUN 22 (*)    Creatinine, Ser 1.17 (*)    GFR calc non Af Amer 51 (*)    GFR calc Af Amer 59 (*)    All other components within normal limits  CBC - Abnormal; Notable for the following components:   Platelets 107 (*)    All other components within normal limits  URINALYSIS, COMPLETE (UACMP) WITH MICROSCOPIC - Abnormal; Notable for the following components:   Color, Urine AMBER (*)    APPearance CLOUDY (*)    Hgb urine dipstick MODERATE (*)    Ketones, ur 20 (*)    Protein, ur 30 (*)    Nitrite POSITIVE (*)    Leukocytes, UA MODERATE (*)    WBC, UA >50 (*)    Bacteria, UA MANY (*)    Squamous Epithelial / LPF >50 (*)    All other components within normal limits  LIPASE, BLOOD   ____________________________________________  EKG  None ____________________________________________  RADIOLOGY   ____________________________________________   PROCEDURES  Procedure(s) performed: No  Procedures   Critical Care performed: No ____________________________________________   INITIAL IMPRESSION / ASSESSMENT AND PLAN / ED COURSE  Pertinent labs & imaging results that were available during my care of the patient were reviewed by me and considered in my medical decision making (see chart for details).  Patient here for depression, she does have a history of major depressive disorder.  I have consulted psychiatry and TTS.  She does not report SI or HI, no indication for IVC at this  time.  She is also having symptoms of viral gastroenteritis which we will treat with IV fluids, IV Zofran.  Overall lab work is reassuring, urinalysis demonstrates likely urinary tract infection, we will treat this with Keflex.  ----------------------------------------- 7:35 PM on 09/05/2018 -----------------------------------------  Patient is feeling much better after fluids and Zofran, I have ordered her home medications pending psych evaluation    ____________________________________________   FINAL CLINICAL IMPRESSION(S) / ED DIAGNOSES  Final diagnoses:  Gastroenteritis  Lower urinary tract infectious disease  Depression, unspecified depression type        Note:  This document was prepared using Dragon voice recognition software and may include unintentional dictation errors.   Lavonia Drafts, MD 09/05/18 Joen Laura

## 2018-09-06 ENCOUNTER — Encounter: Payer: Self-pay | Admitting: Behavioral Health

## 2018-09-06 ENCOUNTER — Inpatient Hospital Stay
Admission: AD | Admit: 2018-09-06 | Discharge: 2018-09-07 | DRG: 882 | Disposition: A | Discharge status: Home / Self Care | Payer: Medicaid Other | Source: Intra-hospital | Attending: Psychiatry | Admitting: Psychiatry

## 2018-09-06 DIAGNOSIS — Z886 Allergy status to analgesic agent status: Secondary | ICD-10-CM

## 2018-09-06 DIAGNOSIS — Z942 Lung transplant status: Secondary | ICD-10-CM | POA: Diagnosis not present

## 2018-09-06 DIAGNOSIS — Z7952 Long term (current) use of systemic steroids: Secondary | ICD-10-CM | POA: Diagnosis not present

## 2018-09-06 DIAGNOSIS — F332 Major depressive disorder, recurrent severe without psychotic features: Secondary | ICD-10-CM | POA: Insufficient documentation

## 2018-09-06 DIAGNOSIS — F4323 Adjustment disorder with mixed anxiety and depressed mood: Principal | ICD-10-CM | POA: Diagnosis present

## 2018-09-06 DIAGNOSIS — Z7982 Long term (current) use of aspirin: Secondary | ICD-10-CM

## 2018-09-06 DIAGNOSIS — Z79899 Other long term (current) drug therapy: Secondary | ICD-10-CM

## 2018-09-06 DIAGNOSIS — F17210 Nicotine dependence, cigarettes, uncomplicated: Secondary | ICD-10-CM | POA: Diagnosis present

## 2018-09-06 DIAGNOSIS — K529 Noninfective gastroenteritis and colitis, unspecified: Secondary | ICD-10-CM | POA: Diagnosis not present

## 2018-09-06 DIAGNOSIS — N390 Urinary tract infection, site not specified: Secondary | ICD-10-CM | POA: Diagnosis present

## 2018-09-06 LAB — GASTROINTESTINAL PANEL BY PCR, STOOL (REPLACES STOOL CULTURE)

## 2018-09-06 LAB — C DIFFICILE QUICK SCREEN W PCR REFLEX
C Diff antigen: NEGATIVE
C Diff interpretation: NOT DETECTED
C Diff toxin: NEGATIVE

## 2018-09-06 MED ORDER — CALCIUM CITRATE-VITAMIN D 500-500 MG-UNIT PO CHEW
1.0000 | CHEWABLE_TABLET | Freq: Every day | ORAL | Status: DC
  Administered 2018-09-06 – 2018-09-07 (×2): 1 via ORAL
  Filled 2018-09-06 (×2): qty 1

## 2018-09-06 MED ORDER — ONDANSETRON 4 MG PO TBDP
4.0000 mg | ORAL_TABLET | Freq: Four times a day (QID) | ORAL | Status: DC | PRN
  Administered 2018-09-06: 4 mg via ORAL
  Filled 2018-09-06: qty 1

## 2018-09-06 MED ORDER — ADULT MULTIVITAMIN W/MINERALS CH
1.0000 | ORAL_TABLET | Freq: Every day | ORAL | Status: DC
  Administered 2018-09-06 – 2018-09-07 (×2): 1 via ORAL
  Filled 2018-09-06 (×2): qty 1

## 2018-09-06 MED ORDER — BIOTIN 5 MG PO CAPS: 1.0000 | ORAL_CAPSULE | Freq: Every day | ORAL | Status: DC

## 2018-09-06 MED ORDER — CEPHALEXIN 500 MG PO CAPS
500.0000 mg | ORAL_CAPSULE | Freq: Two times a day (BID) | ORAL | Status: DC
  Administered 2018-09-06 – 2018-09-07 (×2): 500 mg via ORAL
  Filled 2018-09-06 (×2): qty 1

## 2018-09-06 MED ORDER — MYCOPHENOLATE MOFETIL 250 MG PO CAPS
500.0000 mg | ORAL_CAPSULE | Freq: Two times a day (BID) | ORAL | Status: DC
  Administered 2018-09-06 – 2018-09-07 (×2): 500 mg via ORAL
  Filled 2018-09-06 (×3): qty 2

## 2018-09-06 MED ORDER — LOPERAMIDE HCL 2 MG PO CAPS
4.0000 mg | ORAL_CAPSULE | Freq: Once | ORAL | Status: AC
  Administered 2018-09-06: 4 mg via ORAL
  Filled 2018-09-06 (×2): qty 2

## 2018-09-06 MED ORDER — ACETAMINOPHEN 325 MG PO TABS: 650.0000 mg | ORAL_TABLET | Freq: Four times a day (QID) | ORAL | Status: DC | PRN

## 2018-09-06 MED ORDER — FOSFOMYCIN TROMETHAMINE 3 G PO PACK
3.0000 g | PACK | Freq: Once | ORAL | Status: AC
  Administered 2018-09-06: 3 g via ORAL
  Filled 2018-09-06: qty 3

## 2018-09-06 MED ORDER — MAGNESIUM HYDROXIDE 400 MG/5ML PO SUSP: 30.0000 mL | Freq: Every day | ORAL | Status: DC | PRN

## 2018-09-06 MED ORDER — PREDNISONE 5 MG PO TABS
5.0000 mg | ORAL_TABLET | Freq: Every day | ORAL | Status: DC
  Administered 2018-09-07: 5 mg via ORAL
  Filled 2018-09-06 (×2): qty 1

## 2018-09-06 MED ORDER — ESCITALOPRAM OXALATE 10 MG PO TABS
10.0000 mg | ORAL_TABLET | Freq: Every day | ORAL | Status: DC
  Administered 2018-09-06 – 2018-09-07 (×2): 10 mg via ORAL
  Filled 2018-09-06 (×2): qty 1

## 2018-09-06 MED ORDER — LOPERAMIDE HCL 2 MG PO CAPS
4.0000 mg | ORAL_CAPSULE | Freq: Two times a day (BID) | ORAL | Status: DC | PRN
  Administered 2018-09-06 – 2018-09-07 (×2): 4 mg via ORAL
  Filled 2018-09-06 (×2): qty 2

## 2018-09-06 MED ORDER — ATORVASTATIN CALCIUM 20 MG PO TABS
40.0000 mg | ORAL_TABLET | Freq: Every day | ORAL | Status: DC
  Administered 2018-09-07: 40 mg via ORAL
  Filled 2018-09-06: qty 2

## 2018-09-06 MED ORDER — ALUM & MAG HYDROXIDE-SIMETH 200-200-20 MG/5ML PO SUSP: 30.0000 mL | ORAL | Status: DC | PRN

## 2018-09-06 MED ORDER — MAGNESIUM OXIDE 400 (241.3 MG) MG PO TABS
400.0000 mg | ORAL_TABLET | Freq: Every day | ORAL | Status: DC
  Administered 2018-09-06 – 2018-09-07 (×2): 400 mg via ORAL
  Filled 2018-09-06 (×2): qty 1

## 2018-09-06 MED ORDER — MIRTAZAPINE 15 MG PO TABS
15.0000 mg | ORAL_TABLET | Freq: Every day | ORAL | Status: DC
  Administered 2018-09-06: 15 mg via ORAL
  Filled 2018-09-06: qty 1

## 2018-09-06 MED ORDER — TACROLIMUS 1 MG PO CAPS
2.0000 mg | ORAL_CAPSULE | Freq: Two times a day (BID) | ORAL | Status: DC
  Administered 2018-09-06 – 2018-09-07 (×2): 2 mg via ORAL
  Filled 2018-09-06 (×2): qty 2

## 2018-09-06 MED ORDER — SULFAMETHOXAZOLE-TRIMETHOPRIM 400-80 MG PO TABS
1.5000 | ORAL_TABLET | Freq: Every day | ORAL | Status: DC
  Administered 2018-09-06 – 2018-09-07 (×2): 1.5 via ORAL
  Filled 2018-09-06 (×2): qty 2

## 2018-09-06 MED ORDER — ASPIRIN 81 MG PO CHEW
81.0000 mg | CHEWABLE_TABLET | Freq: Every day | ORAL | Status: DC
  Administered 2018-09-06 – 2018-09-07 (×2): 81 mg via ORAL
  Filled 2018-09-06 (×2): qty 1

## 2018-09-06 MED ORDER — VITAMIN B-1 100 MG PO TABS
100.0000 mg | ORAL_TABLET | Freq: Every day | ORAL | Status: DC
  Administered 2018-09-06 – 2018-09-07 (×2): 100 mg via ORAL
  Filled 2018-09-06 (×2): qty 1

## 2018-09-06 NOTE — ED Notes (Signed)
Pt. Transferred to [place] from ED to room after screening for contraband. Report to include Situation, Background, Assessment and Recommendations from [facility]. Pt. Oriented to unit including Q15 minute rounds as well as the security cameras for their protection. Patient is alert and oriented, warm and dry in no acute distress. Patient denies SI, HI, and AVH. Pt. Encouraged to let me know if needs arise.

## 2018-09-06 NOTE — ED Notes (Signed)
Patient transferring to BMU room 305 from [facility] room 5. NAD noted, patient pleasant and cooperative

## 2018-09-06 NOTE — ED Notes (Signed)
Hourly rounding reveals patient in room. No complaints, stable, in no acute distress. Q15 minute rounds and monitoring via Security Cameras to continue. 

## 2018-09-06 NOTE — ED Notes (Signed)
Hourly rounding reveals patient sleeping in room. No complaints, stable, in no acute distress. Q15 minute rounds and monitoring via Security Cameras to continue. 

## 2018-09-06 NOTE — BH Assessment (Signed)
Patient is to be admitted to [facility] by Dr. Leverne Humbles.  Attending Physician will be Dr. Weber Cooks.   Patient has been assigned to room 305, by Josephine Nurse [place].   Intake Paper Work has been signed and placed on patient chart.  ER staff is aware of the admission:  Lattie Haw, ER Secretary    Dr. Archie Balboa, ER MD   Donneta Romberg, Patient's Nurse   Elberta Fortis, Patient Access.

## 2018-09-06 NOTE — Tx Team (Signed)
Initial Treatment Plan 09/06/2018 5:24 PM Katrina White [id number]    PATIENT STRESSORS: Financial difficulties   PATIENT STRENGTHS: Ability for insight Communication skills Supportive family/friends   PATIENT IDENTIFIED PROBLEMS: Ineffective coping  Homelessness  Financial difficulties                 DISCHARGE CRITERIA:  Improved stabilization in mood, thinking, and/or behavior  PRELIMINARY DISCHARGE PLAN: Attend PHP/IOP Placement in alternative living arrangements  PATIENT/FAMILY INVOLVEMENT: This treatment plan has been presented to and reviewed with the patient, Katrina White, and/or family member.  The patient and family have been given the opportunity to ask questions and make suggestions.  Geraldo Docker, RN 09/06/2018, 5:24 PM

## 2018-09-06 NOTE — ED Notes (Signed)
Hourly rounding reveals patient in room. No complaints, stable, in no acute distress. Q15 minute rounds and monitoring via [place] and Officer to continue. Snack and beverage given.  

## 2018-09-06 NOTE — Progress Notes (Signed)

## 2018-09-06 NOTE — Progress Notes (Signed)
Patient reevaluated at request of emergency room physician regarding patient safety. Patient reports that she has had increased significant social stress.  She is currently homeless, but attempting to work.  She makes regular trips to [facility] for mental health, as well as to meet with the transplant team status post lung transplant.  Patient reports that she has been maintained on Lexapro 10 mg daily and Remeron 15 mg at night by her psychiatrist of the past 2 years, Dr. Valeda Malm.  Patient currently does not have a therapist.  She has been having worsening depression and although not suicidal, is not certain that she can maintain her own safety.  She denies homicidal ideation.  She is denying auditory and visual hallucinations. We will recommend inpatient admission to ensure patient safety in this state of transition.  Major depressive disorder, recurrent, severe Adjustment disorder with depressed and anxious mood.  Lavella Hammock, MD

## 2018-09-06 NOTE — Plan of Care (Signed)
New admission to the BMU. Patient is a 58 year old female who came into the ED due to have nausea, vomiting and diarrhea. While in the Emergency department patient mentioned she felt depressed and was admitted to this unit. During admission process patient denies SI, HI and AVH. Patient states she is homeless and became homeless on last Friday morning at 9 A.M. and has been depressed about living situation. Patient states the only substance she uses is marijuana "Every now and then." Patient states she smokes cigarettes but does not think at this time she will want a nicotine patch. Patient was very flat and sad during admission, forwarding very little, but appropriate. Skin is intact, no wounds or contraband found on person.

## 2018-09-07 DIAGNOSIS — F4323 Adjustment disorder with mixed anxiety and depressed mood: Principal | ICD-10-CM

## 2018-09-07 DIAGNOSIS — N390 Urinary tract infection, site not specified: Secondary | ICD-10-CM

## 2018-09-07 MED ORDER — CEPHALEXIN 500 MG PO CAPS: 500.0000 mg | ORAL_CAPSULE | Freq: Two times a day (BID) | ORAL | 0 refills | Status: DC

## 2018-09-07 MED ORDER — MIRTAZAPINE 15 MG PO TABS: 15.0000 mg | ORAL_TABLET | Freq: Every day | ORAL | 2 refills | Status: DC

## 2018-09-07 MED ORDER — ESCITALOPRAM OXALATE 10 MG PO TABS: 10.0000 mg | ORAL_TABLET | Freq: Every day | ORAL | 2 refills | Status: AC

## 2018-09-07 MED ORDER — TACROLIMUS 1 MG PO CAPS
1.0000 mg | ORAL_CAPSULE | Freq: Two times a day (BID) | ORAL | Status: DC
  Filled 2018-09-07: qty 1

## 2018-09-07 MED ORDER — MIRTAZAPINE 15 MG PO TABS: 15.0000 mg | ORAL_TABLET | Freq: Every day | ORAL | 2 refills | Status: AC

## 2018-09-07 MED ORDER — ATORVASTATIN CALCIUM 20 MG PO TABS: 40.0000 mg | ORAL_TABLET | Freq: Every day | ORAL | Status: DC

## 2018-09-07 MED ORDER — ESCITALOPRAM OXALATE 10 MG PO TABS: 10.0000 mg | ORAL_TABLET | Freq: Every day | ORAL | 2 refills | Status: DC

## 2018-09-07 NOTE — BHH Suicide Risk Assessment (Signed)
[facility] Admission Suicide Risk Assessment   Nursing information obtained from:  Patient Demographic factors:  Low socioeconomic status, Living alone, Unemployed Current Mental Status:  Self-harm thoughts Loss Factors:  Decrease in vocational status, Financial problems / change in socioeconomic status Historical Factors:  NA Risk Reduction Factors:  Positive social support, Positive coping skills or problem solving skills  Total Time spent with patient: 1 hour Principal Problem: <principal problem not specified> Diagnosis:  Active Problems:   MDD (major depressive disorder), recurrent severe, without psychosis ([place])  Subjective Data: Patient admitted through the emergency room where she has been for about a day and a half.  She is currently denying any suicidal thought or intent at all.  She states that her bad mood when she came to the emergency room was related to her circumstances.  She is able to articulate a plan currently to manage her circumstances going forward and says her mood is better.  Does not feel like she is depressed.  Not intoxicated or abusing substances.  No sign of psychosis.  Patient strongly feels that she is safe to take care of herself outside the hospital  Continued Clinical Symptoms:  Alcohol Use Disorder Identification Test Final Score (AUDIT): 0 The "Alcohol Use Disorders Identification Test", Guidelines for Use in Primary Care, Second Edition.  World Pharmacologist [facility]). Score between 0-7:  no or low risk or alcohol related problems. Score between 8-15:  moderate risk of alcohol related problems. Score between 16-19:  high risk of alcohol related problems. Score 20 or above:  warrants further diagnostic evaluation for alcohol dependence and treatment.   CLINICAL FACTORS:   Depression:   Hopelessness   Musculoskeletal: Strength & Muscle Tone: within normal limits Gait & Station: normal Patient leans: N/A  Psychiatric Specialty Exam: Physical Exam   Nursing note and vitals reviewed. Constitutional: She appears well-developed and well-nourished.  HENT:  Head: Normocephalic and atraumatic.  Eyes: Pupils are equal, round, and reactive to light. Conjunctivae are normal.  Neck: Normal range of motion.  Cardiovascular: Regular rhythm and normal heart sounds.  Respiratory: Effort normal. No respiratory distress.  GI: Soft.  Musculoskeletal: Normal range of motion.  Neurological: She is alert.  Skin: Skin is warm and dry.  Psychiatric: Her speech is normal and behavior is normal. Judgment and thought content normal. Her mood appears anxious. Cognition and memory are normal.    Review of Systems  Constitutional: Negative.   HENT: Negative.   Eyes: Negative.   Respiratory: Negative.   Cardiovascular: Negative.   Gastrointestinal: Positive for nausea and vomiting.  Musculoskeletal: Negative.   Skin: Negative.   Neurological: Negative.   Psychiatric/Behavioral: Negative for depression, hallucinations, memory loss, substance abuse and suicidal ideas. The patient is nervous/anxious. The patient does not have insomnia.     Blood pressure 119/80, pulse 94, temperature 98.4 F (36.9 C), temperature source Oral, resp. rate 18, height 5\' 4"  (1.626 m), weight 44.5 kg, SpO2 97 %.Body mass index is 16.84 kg/m.  General Appearance: Fairly Groomed  Eye Contact:  Good  Speech:  Clear and Coherent  Volume:  Normal  Mood:  Euthymic  Affect:  Congruent  Thought Process:  Goal Directed  Orientation:  Full (Time, Place, and Person)  Thought Content:  Logical  Suicidal Thoughts:  No  Homicidal Thoughts:  No  Memory:  Immediate;   Fair Recent;   Fair Remote;   Fair  Judgement:  Good  Insight:  Good  Psychomotor Activity:  Normal  Concentration:  Concentration:  Good  Recall:  Good  Fund of Knowledge:  Good  Language:  Good  Akathisia:  No  Handed:  Right  AIMS (if indicated):     Assets:  Communication Skills Desire for  Improvement Financial Resources/Insurance Resilience Social Support  ADL's:  Intact  Cognition:  WNL  Sleep:  Number of Hours: 7.45      COGNITIVE FEATURES THAT CONTRIBUTE TO RISK:  Thought constriction (tunnel vision)    SUICIDE RISK:   Minimal: No identifiable suicidal ideation.  Patients presenting with no risk factors but with morbid ruminations; may be classified as minimal risk based on the severity of the depressive symptoms  PLAN OF CARE: Patient does not appear to have a major depression or other active major mental health problem but rather to of had some circumstantial stresses which she is working through.  She absolutely denies any suicidal ideation.  Patient no longer requires inpatient treatment and will be discharged to outpatient follow-up with the psychiatrist she is already established with as well as her normal medical team  I certify that inpatient services furnished can reasonably be expected to improve the patient's condition.   Alethia Berthold, MD 09/07/2018, 10:20 AM
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
## 2018-09-07 NOTE — Plan of Care (Signed)
Pt. Denies si/hi/avh, can contract for safety. Pt. Reports she can remain safe while on the unit. Pt. Coping not improved.    Problem: Safety: Goal: Periods of time without injury will increase Outcome: Progressing   Problem: Coping: Goal: Coping ability will improve Outcome: Not Progressing

## 2018-09-07 NOTE — Progress Notes (Signed)
Patient alert and oriented x 4. Ambulates unit with steady gait. Verbally denies SI/HI/AVH and pain. Patient discharged on above date and time. Verbalized understanding the discharge information provided to patient upon discharge. Patient departed unit with discharge paperwork, prescriptions and personal belongings. Picked up by her boyfriend and plans to comply with her follow up discharge instructions.

## 2018-09-07 NOTE — Discharge Summary (Signed)
Physician Discharge Summary Note  Patient:  Katrina White is an 58 y.o., female MRN:  [id number] DOB:  1961/07/20 Patient phone:  [phone] (home)  Patient address:   [address] [place] [address] [place] [address],  Total Time spent with patient: 1 hour  Date of Admission:  09/06/2018 Date of Discharge: September 07, 2018  Reason for Admission: Admitted through the emergency room because of concerns about depression and possible suicidality although the patient has consistently denied suicidal ideation  Principal Problem: Adjustment disorder with mixed anxiety and depressed mood Discharge Diagnoses: Principal Problem:   Adjustment disorder with mixed anxiety and depressed mood Active Problems:   Lung transplant status, bilateral ([place])   Urinary tract infection   Past Psychiatric History: Patient has a past history of depression and has been on maintenance antidepressant medicine for several years.  No history of hospitalization.  No history of suicide attempts.  No history of mania or psychosis.  No history of substance abuse.  Past Medical History:  Past Medical History:  Diagnosis Date  . Cancer (HCC)    Skin  . Colitis   . COPD (chronic obstructive pulmonary disease) ([place])   . Emphysema lung ([place])   . S/P lung transplant ([place]) 2012   BILATERAL    Past Surgical History:  Procedure Laterality Date  . LUNG TRANSPLANT, DOUBLE    . TUBAL LIGATION     Family History:  Family History  Problem Relation Age of Onset  . Breast cancer Neg Hx    Family Psychiatric  History: Denies Social History:  Social History   Substance and Sexual Activity  Alcohol Use Yes   Comment: socially (3-4drinks a week)     Social History   Substance and Sexual Activity  Drug Use Never    Social History   Socioeconomic History  . Marital status: Married    Spouse name: Not on file  . Number of children: Not on file  . Years of education: Not on file  . Highest education level: Not  on file  Occupational History  . Not on file  Social Needs  . Financial resource strain: Not on file  . Food insecurity:    Worry: Not on file    Inability: Not on file  . Transportation needs:    Medical: Not on file    Non-medical: Not on file  Tobacco Use  . Smoking status: Current Every Day Smoker    Types: Cigarettes  . Smokeless tobacco: Never Used  . Tobacco comment: less than a pack a week plan to quit for mothers day  Substance and Sexual Activity  . Alcohol use: Yes    Comment: socially (3-4drinks a week)  . Drug use: Never  . Sexual activity: Not on file  Lifestyle  . Physical activity:    Days per week: Not on file    Minutes per session: Not on file  . Stress: Not on file  Relationships  . Social connections:    Talks on phone: Not on file    Gets together: Not on file    Attends religious service: Not on file    Active member of club or organization: Not on file    Attends meetings of clubs or organizations: Not on file    Relationship status: Not on file  Other Topics Concern  . Not on file  Social History Narrative  . Not on file    Hospital Course: Admitted to the hospital after a day and a  half in the emergency room the patient is very clear in telling me that her bad mood was entirely due to the circumstances of not having a safe place to stay.  She now has gotten her paycheck and been in touch with her boyfriend and has a safe place to live.  Denies depressive symptoms.  Completely denies suicidal ideation.  No evidence of any recent suicidal behavior.  No longer meets commitment criteria.  Patient will be discharged from the hospital.  She had claimed to have psychiatric follow-up at [facility] but upon checking that social work discovered that she is not an active patient there.  We will therefore give her information to get in touch with starting treatment there and also I have given her prescriptions for her antidepressant medicine.  Case reviewed with social  work and nursing and treatment team  Physical Findings: AIMS:  , ,  ,  ,    CIWA:    COWS:     Musculoskeletal: Strength & Muscle Tone: within normal limits Gait & Station: normal Patient leans: N/A  Psychiatric Specialty Exam: Physical Exam  Nursing note and vitals reviewed. Constitutional: She appears well-developed and well-nourished.  HENT:  Head: Normocephalic and atraumatic.  Eyes: Pupils are equal, round, and reactive to light. Conjunctivae are normal.  Neck: Normal range of motion.  Cardiovascular: Normal heart sounds.  Respiratory: Effort normal.  GI: Soft.  Musculoskeletal: Normal range of motion.  Neurological: She is alert.  Skin: Skin is warm and dry.  Psychiatric: She has a normal mood and affect. Her behavior is normal. Judgment and thought content normal.    Review of Systems  Constitutional: Negative.   HENT: Negative.   Eyes: Negative.   Respiratory: Negative.   Cardiovascular: Negative.   Gastrointestinal: Positive for nausea and vomiting.  Musculoskeletal: Negative.   Skin: Negative.   Neurological: Negative.   Psychiatric/Behavioral: Negative.     Blood pressure 119/80, pulse 94, temperature 98.4 F (36.9 C), temperature source Oral, resp. rate 18, height 5\' 4"  (1.626 m), weight 44.5 kg, SpO2 97 %.Body mass index is 16.84 kg/m.  General Appearance: Fairly Groomed  Eye Contact:  Good  Speech:  Clear and Coherent  Volume:  Normal  Mood:  Euthymic  Affect:  Congruent  Thought Process:  Coherent  Orientation:  Full (Time, Place, and Person)  Thought Content:  Logical  Suicidal Thoughts:  No  Homicidal Thoughts:  No  Memory:  Immediate;   Fair Recent;   Fair Remote;   Fair  Judgement:  Fair  Insight:  Fair  Psychomotor Activity:  Normal  Concentration:  Concentration: Fair  Recall:  [employer] of Knowledge:  Fair  Language:  Fair  Akathisia:  No  Handed:  Right  AIMS (if indicated):     Assets:  Communication Skills Desire for  Improvement Financial Resources/Insurance Resilience Social Support  ADL's:  Intact  Cognition:  WNL  Sleep:  Number of Hours: 7.45     Have you used any form of tobacco in the last 30 days? (Cigarettes, Smokeless Tobacco, Cigars, and/or Pipes): Yes  Has this patient used any form of tobacco in the last 30 days? (Cigarettes, Smokeless Tobacco, Cigars, and/or Pipes) Yes, No  Blood Alcohol level:  No results found for: [facility]  Metabolic Disorder Labs:  No results found for: HGBA1C, MPG No results found for: PROLACTIN No results found for: CHOL, TRIG, HDL, CHOLHDL, VLDL, LDLCALC  See Psychiatric Specialty Exam and Suicide Risk Assessment completed by Attending  Physician prior to discharge.  Discharge destination:  Home  Is patient on multiple antipsychotic therapies at discharge:  No   Has Patient had three or more failed trials of antipsychotic monotherapy by history:  No  Recommended Plan for Multiple Antipsychotic Therapies: NA  Discharge Instructions    Diet - low sodium heart healthy   Complete by:  As directed    Increase activity slowly   Complete by:  As directed      Allergies as of 09/07/2018      Reactions   Nsaids       Medication List    STOP taking these medications   metroNIDAZOLE 500 MG tablet Commonly known as:  FLAGYL     TAKE these medications     Indication  aspirin 81 MG chewable tablet Chew 81 mg by mouth daily.  Indication:  Inflammation   atorvastatin 40 MG tablet Commonly known as:  LIPITOR Take 40 mg by mouth daily.  Indication:  High Amount of Fats in the Blood   BIOTIN MAXIMUM STRENGTH 5 MG Caps Generic drug:  Biotin Take 1 capsule by mouth daily.  Indication:  Vitamin Deficiency   calcium citrate-vitamin D 315-200 MG-UNIT tablet Commonly known as:  CITRACAL+D Take 1 tablet by mouth daily.  Indication:  Low Amount of Calcium in the Blood   cephALEXin 500 MG capsule Commonly known as:  KEFLEX Take 1 capsule (500 mg total) by  mouth every 12 (twelve) hours.  Indication:  Infection of Genitals and/or Urinary Tract   escitalopram 10 MG tablet Commonly known as:  LEXAPRO Take 1 tablet (10 mg total) by mouth daily.  Indication:  Major Depressive Disorder   magnesium oxide 400 MG tablet Commonly known as:  MAG-OX Take 400 mg by mouth daily.  Indication:  Disorder with Low Magnesium Levels   mirtazapine 15 MG tablet Commonly known as:  REMERON Take 1 tablet (15 mg total) by mouth at bedtime.  Indication:  Major Depressive Disorder   MULTI-VITAMINS Tabs Take 1 tablet by mouth daily.  Indication:  Vitamin Deficiency   mycophenolate 500 MG tablet Commonly known as:  CELLCEPT Take 500 mg by mouth 2 (two) times daily.  Indication:  Lung Transplant Recipient   ondansetron 4 MG disintegrating tablet Commonly known as:  ZOFRAN ODT Take 1 tablet (4 mg total) by mouth every 8 (eight) hours as needed for nausea or vomiting.  Indication:  Nausea and Vomiting   predniSONE 10 MG (21) Tbpk tablet Commonly known as:  STERAPRED UNI-PAK 21 TAB 6 day taper - take by mouth as directed for 6 days What changed:    how much to take  when to take this  additional instructions  Indication:  Organ or Tissue Transplant Recipient   sulfamethoxazole-trimethoprim 400-80 MG tablet Commonly known as:  BACTRIM,SEPTRA Take 1.5 tablets by mouth daily.  Indication:  Chronic immunosuppression   tacrolimus 1 MG capsule Commonly known as:  PROGRAF Take 2 mg by mouth 2 (two) times daily.  Indication:  Lung Transplant Recipient   thiamine 100 MG tablet Take 100 mg by mouth daily.  Indication:  Deficiency of Vitamin B1      Follow-up Information    Dr. Valeda Malm. Call.   Why:  Please call the clinic that you saw Dr. Owens Shark and ask if they can send a message to Dr. Owens Shark and request to see you in the [place] clinic.  Note that you may need to call the Medicaid office and have the provider  address on card changed to that  clinic Contact information: [address] [place],  [place], [place] [address] [phone]          Follow-up recommendations:  Activity:  Activity as tolerated Diet:  Regular diet or what ever is recommended by her outpatient medical providers Other:  Follow-up with outpatient treatment at [facility]  Comments: Patient will be given information from [facility] about how to get back involved with their clinic and will be given antidepressant medicine.  I spoke with her about whether changes in medicine would be appropriate and she felt better with just staying on her current doses.  Signed: Alethia Berthold, MD 09/07/2018, 10:45 AM

## 2018-09-07 NOTE — Progress Notes (Signed)
D: Pt denies SI/HI/AVH, can contract for safety. Pt is pleasant and cooperative, but minimal. Pt. Complains of diarrhea and nausea, given PRN medications for this with good result. Pt. Reports depressed mood. Pt. Affect flat.   A: Q x 15 minute observation checks were completed for safety. Patient was provided with education. Patient was given/offered medications per orders. Patient  was encourage to attend groups, participate in unit activities and continue with plan of care. Pt. Chart and plans of care reviewed. Pt. Given support and encouragement.   R: Patient is complaint with medication. Pt. Does not participate in unit activities and or groups. Pt. Mostly isolative and withdrawn to room the entire shift.              Precautionary checks every 15 minutes for safety maintained, room free of safety hazards, patient sustains no injury or falls during this shift. Will endorse care to next shift.

## 2018-09-07 NOTE — BHH Counselor (Signed)
Pt is being discharged before a treatment team could be held for her.  Assunta Curtis, MSW, LCSW 09/07/2018 11:54 AM

## 2018-09-07 NOTE — BHH Suicide Risk Assessment (Signed)
[facility] INPATIENT:  Family/Significant Other Suicide Prevention Education  Suicide Prevention Education:  Education Completed; Lamija Besse, mother, [phone] has been identified by the patient as the family member/significant other with whom the patient will be residing, and identified as the person(s) who will aid the patient in the event of a mental health crisis (suicidal ideations/suicide attempt).  With written consent from the patient, the family member/significant other has been provided the following suicide prevention education, prior to the and/or following the discharge of the patient.  The suicide prevention education provided includes the following:  Suicide risk factors  Suicide prevention and interventions  National Suicide Hotline telephone number  [facility] assessment telephone number  [facility] Emergency Assistance [place] and/or Residential Mobile Crisis Unit telephone number  Request made of family/significant other to:  Remove weapons (e.g., guns, rifles, knives), all items previously/currently identified as safety concern.    Remove drugs/medications (over-the-counter, prescriptions, illicit drugs), all items previously/currently identified as a safety concern.  The family member/significant other verbalizes understanding of the suicide prevention education information provided.  The family member/significant other agrees to remove the items of safety concern listed above.  CSW spoke with the the patient's daughter who reports that the patient is not going to be able to go to the boyfriends home as the boyfriend lives with his aunt and she is not able to stay because the apartments are based on income and will negatively impact the boyfriends aunt.  Pt's daughter reports that to her knowledge the patient does not have access to any weapons.  Rozann Lesches 09/07/2018, 10:38 AM

## 2018-09-07 NOTE — Progress Notes (Signed)
Recreation Therapy Notes  Date: 09/07/2018  Time: 9:30 am   Location: Craft room   Behavioral response: N/A   Intervention Topic: Happiness  Discussion/Intervention: Patient did not attend group.   Clinical Observations/Feedback:  Patient did not attend group.   Kanoelani Dobies LRT/CTRS        Dontreal Miera 09/07/2018 10:51 AM

## 2018-09-07 NOTE — BHH Suicide Risk Assessment (Signed)
[facility] Discharge Suicide Risk Assessment   Principal Problem: Adjustment disorder with mixed anxiety and depressed mood Discharge Diagnoses: Principal Problem:   Adjustment disorder with mixed anxiety and depressed mood Active Problems:   Lung transplant status, bilateral (HCC)   Urinary tract infection   Total Time spent with patient: 45 minutes  Musculoskeletal: Strength & Muscle Tone: within normal limits Gait & Station: normal Patient leans: N/A  Psychiatric Specialty Exam: ROS  Blood pressure 119/80, pulse 94, temperature 98.4 F (36.9 C), temperature source Oral, resp. rate 18, height 5\' 4"  (1.626 m), weight 44.5 kg, SpO2 97 %.Body mass index is 16.84 kg/m.  General Appearance: Fairly Groomed  Engineer, water::  Good  Speech:  Clear and Coherent409  Volume:  Normal  Mood:  Euthymic  Affect:  Congruent  Thought Process:  Coherent  Orientation:  Full (Time, Place, and Person)  Thought Content:  Logical  Suicidal Thoughts:  No  Homicidal Thoughts:  No  Memory:  Immediate;   Fair Recent;   Fair Remote;   Fair  Judgement:  Fair  Insight:  Good  Psychomotor Activity:  Normal  Concentration:  Fair  Recall:  [employer] [place]  Language: Fair  Akathisia:  No  Handed:  Right  AIMS (if indicated):     Assets:  Communication Skills Desire for Improvement Financial Resources/Insurance Resilience Social Support  Sleep:  Number of Hours: 7.45  Cognition: WNL  ADL's:  Intact   Mental Status Per Nursing Assessment::   On Admission:  Self-harm thoughts  Demographic Factors:  Caucasian  Loss Factors: Financial problems/change in socioeconomic status  Historical Factors: Impulsivity  Risk Reduction Factors:   Religious beliefs about death, Employed, Positive social support and Positive therapeutic relationship  Continued Clinical Symptoms:  Depression:   Anhedonia  Cognitive Features That Contribute To Risk:  Thought constriction (tunnel vision)     Suicide Risk:  Minimal: No identifiable suicidal ideation.  Patients presenting with no risk factors but with morbid ruminations; may be classified as minimal risk based on the severity of the depressive symptoms    Plan Of Care/Follow-up recommendations:  Activity:  Activity as tolerated Diet:  Regular diet door as prescribed by primary care doctors Other:  Patient needs to follow-up with an outpatient psychiatrist and will be given information regarding how to do that  Alethia Berthold, MD 09/07/2018, 10:25 AM
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
## 2018-09-07 NOTE — Progress Notes (Signed)
  [facility] Adult Case Management Discharge Plan :  Will you be returning to the same living situation after discharge:  No.  Patient will renting a hotel room. At discharge, do you have transportation home?: Yes,  pt reports her boyfriend will pick her up. Do you have the ability to pay for your medications: Yes,  pt reports that she has Medicaid  Release of information consent forms completed and in the chart;  Patient's signature needed at discharge.  Patient to Follow up at: Follow-up Information    Dr. Valeda Malm. Call.   Why:  Please call the clinic that you saw Dr. Owens Shark and ask if they can send a message to Dr. Owens Shark and request to see you in the [place] clinic.  Note that you may need to call the Medicaid office and have the provider address on card changed to that clinic Contact information: [address] [place],  [place], [place] [address] [phone]          Next level of care provider has access to [place] and Suicide Prevention discussed: Yes,  SPE completed with the patient's daughter.  Have you used any form of tobacco in the last 30 days? (Cigarettes, Smokeless Tobacco, Cigars, and/or Pipes): Yes  Has patient been referred to the Quitline?: Patient refused referral  Patient has been referred for addiction treatment: [place], LCSW 09/07/2018, 11:52 AM

## 2019-01-04 ENCOUNTER — Ambulatory Visit: Payer: Self-pay | Admitting: Nurse Practitioner

## 2020-04-11 ENCOUNTER — Ambulatory Visit: Payer: Medicaid Other | Admitting: Nurse Practitioner

## 2020-04-11 ENCOUNTER — Other Ambulatory Visit: Payer: Self-pay

## 2020-04-11 ENCOUNTER — Encounter: Payer: Self-pay | Admitting: Nurse Practitioner

## 2020-04-11 VITALS — BP 126/75 | HR 92 | Temp 97.6°F | Resp 16 | Ht 64.0 in | Wt 75.0 lb

## 2020-04-11 DIAGNOSIS — Z124 Encounter for screening for malignant neoplasm of cervix: Secondary | ICD-10-CM | POA: Diagnosis not present

## 2020-04-11 DIAGNOSIS — Z0001 Encounter for general adult medical examination with abnormal findings: Secondary | ICD-10-CM | POA: Diagnosis not present

## 2020-04-11 DIAGNOSIS — E440 Moderate protein-calorie malnutrition: Secondary | ICD-10-CM | POA: Diagnosis not present

## 2020-04-11 DIAGNOSIS — T86810 Lung transplant rejection: Secondary | ICD-10-CM

## 2020-04-11 DIAGNOSIS — R300 Dysuria: Secondary | ICD-10-CM

## 2020-04-11 DIAGNOSIS — Z1231 Encounter for screening mammogram for malignant neoplasm of breast: Secondary | ICD-10-CM

## 2020-04-11 NOTE — Progress Notes (Signed)
[facility] [place], [place] [address]  Internal MEDICINE  Office Visit Note  Patient Name: Katrina White  [id number]  [id number]  Date of Service: 04/27/2020   Pt is here for routine health maintenance examination  Chief Complaint  Patient presents with  . Annual Exam  . Quality Metric Gaps    mammogram, pap, hepC, HIV screening     The patient is here for health maintenance exam and pap smear. She continues to be followed by [facility] transplant team. She states that she is now in rejection. She is not a candidate for further transplantation. She states that she has tremendous amount of nausea and decreased appetite. She has lost considerable amount of weight. She does have some shortness of breath, especially with exertion. She is due to have screening mammogram.     Current Medication: Outpatient Encounter Medications as of 04/11/2020  Medication Sig  . aspirin 81 MG chewable tablet Chew 81 mg by mouth daily.   Marland Kitchen atorvastatin (LIPITOR) 40 MG tablet Take 40 mg by mouth daily.  Marland Kitchen azaTHIOprine (IMURAN) 50 MG tablet Take 50 mg by mouth daily.  . Biotin (BIOTIN MAXIMUM STRENGTH) 5 MG CAPS Take 1 capsule by mouth daily.   Marland Kitchen escitalopram (LEXAPRO) 10 MG tablet Take 1 tablet (10 mg total) by mouth daily.  . mirtazapine (REMERON) 15 MG tablet Take 1 tablet (15 mg total) by mouth at bedtime.  . Multiple Vitamin (MULTI-VITAMINS) TABS Take 1 tablet by mouth daily.   Marland Kitchen OLANZapine (ZYPREXA) 15 MG tablet Take 15 mg by mouth at bedtime.  . predniSONE (STERAPRED UNI-PAK 21 TAB) 10 MG (21) TBPK tablet 6 day taper - take by mouth as directed for 6 days (Patient taking differently: 5 mg daily. )  . sulfamethoxazole-trimethoprim (BACTRIM,SEPTRA) 400-80 MG tablet Take 1.5 tablets by mouth daily.   . tacrolimus (PROGRAF) 1 MG capsule Take 2 mg by mouth 2 (two) times daily.  Marland Kitchen thiamine 100 MG tablet Take 100 mg by mouth daily.   . [DISCONTINUED] calcium citrate-vitamin D  (CITRACAL+D) 315-200 MG-UNIT tablet Take 1 tablet by mouth daily.  (Patient not taking: Reported on 04/11/2020)  . [DISCONTINUED] cephALEXin (KEFLEX) 500 MG capsule Take 1 capsule (500 mg total) by mouth every 12 (twelve) hours. (Patient not taking: Reported on 04/11/2020)  . [DISCONTINUED] magnesium oxide (MAG-OX) 400 MG tablet Take 400 mg by mouth daily.  (Patient not taking: Reported on 04/11/2020)  . [DISCONTINUED] mycophenolate (CELLCEPT) 500 MG tablet Take 500 mg by mouth 2 (two) times daily.  (Patient not taking: Reported on 04/11/2020)  . [DISCONTINUED] ondansetron (ZOFRAN ODT) 4 MG disintegrating tablet Take 1 tablet (4 mg total) by mouth every 8 (eight) hours as needed for nausea or vomiting. (Patient not taking: Reported on 04/11/2020)   No facility-administered encounter medications on file as of 04/11/2020.    Surgical History: Past Surgical History:  Procedure Laterality Date  . LUNG TRANSPLANT, DOUBLE    . TUBAL LIGATION      Medical History: Past Medical History:  Diagnosis Date  . Cancer (HCC)    Skin  . Colitis   . COPD (chronic obstructive pulmonary disease) ([place])   . Emphysema lung ([place])   . 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 Breast cancer Neg Hx       Review of Systems  Constitutional: Positive for appetite change and unexpected weight change. Negative  for activity change, chills and fatigue.       She has lost 27 pounds over the past two years.   HENT: Negative for congestion, postnasal drip, rhinorrhea, sneezing and sore throat.   Respiratory: Positive for shortness of breath and wheezing. Negative for cough and chest tightness.   Cardiovascular: Negative for chest pain and palpitations.  Gastrointestinal: Positive for nausea. Negative for abdominal pain, constipation, diarrhea and vomiting.  Endocrine: Negative for cold intolerance, heat intolerance, polydipsia and polyuria.   Genitourinary: Negative for dysuria, frequency and urgency.  Musculoskeletal: Negative for arthralgias, back pain, joint swelling and neck pain.  Skin: Negative for rash.  Allergic/Immunologic: Negative for environmental allergies.  Neurological: Negative for dizziness, tremors, numbness and headaches.  Hematological: Negative for adenopathy. Does not bruise/bleed easily.  Psychiatric/Behavioral: Negative for behavioral problems (Depression), sleep disturbance and suicidal ideas. The patient is nervous/anxious.      Today's Vitals   04/11/20 0905  BP: 126/75  Pulse: 92  Resp: 16  Temp: 97.6 F (36.4 C)  SpO2: 93%  Weight: 75 lb (34 kg)  Height: 5\' 4"  (1.626 m)   Body mass index is 12.87 kg/m.  Physical Exam Vitals and nursing note reviewed.  Constitutional:      General: She is not in acute distress.    Appearance: Normal appearance. She is well-developed and underweight. She is ill-appearing. She is not diaphoretic.  HENT:     Head: Normocephalic and atraumatic.     Nose: Nose normal.     Mouth/Throat:     Pharynx: No oropharyngeal exudate.  Eyes:     Pupils: Pupils are equal, round, and reactive to light.  Neck:     Thyroid: No thyromegaly.     Vascular: No JVD.     Trachea: No tracheal deviation.  Cardiovascular:     Rate and Rhythm: Normal rate and regular rhythm.     Pulses: Normal pulses.     Heart sounds: Normal heart sounds. No murmur heard.  No friction rub. No gallop.   Pulmonary:     Effort: Pulmonary effort is normal. No respiratory distress.     Breath sounds: Normal breath sounds. No wheezing or rales.     Comments: Breath sounds are diminished throughout the lung fields.  Chest:     Chest wall: No tenderness.     Breasts:        Right: Normal. No swelling, bleeding, inverted nipple, mass, nipple discharge, skin change or tenderness.        Left: Normal. No swelling, bleeding, inverted nipple, mass, nipple discharge, skin change or tenderness.   Abdominal:     General: Bowel sounds are normal.     Palpations: Abdomen is soft.     Tenderness: There is no abdominal tenderness.     Hernia: There is no hernia in the left inguinal area or right inguinal area.  Genitourinary:    Exam position: Supine.     Labia:        Right: No tenderness or lesion.        Left: No tenderness or lesion.      Vagina: No vaginal discharge, erythema or tenderness.     Cervix: No cervical motion tenderness, discharge, friability or erythema.     Uterus: Normal.      Adnexa: Right adnexa normal and left adnexa normal.     Comments: No tenderness, masses, or organomeglay present during bimanual exam . Musculoskeletal:        General: Normal range  of motion.     Cervical back: Normal range of motion and neck supple.  Lymphadenopathy:     Cervical: No cervical adenopathy.     Upper Body:     Right upper body: No axillary adenopathy.     Left upper body: No axillary adenopathy.     Lower Body: No right inguinal adenopathy. No left inguinal adenopathy.  Skin:    General: Skin is warm and dry.  Neurological:     General: No focal deficit present.     Mental Status: She is alert and oriented to person, place, and time.     Cranial Nerves: No cranial nerve deficit.  Psychiatric:        Mood and Affect: Mood normal.        Behavior: Behavior normal.        Thought Content: Thought content normal.        Judgment: Judgment normal.      LABS: Recent Results (from the past 2160 hour(s))  UA/M w/rflx Culture, Routine     Status: None   Collection Time: 04/11/20  3:47 PM   Specimen: Urine   Urine  Result Value Ref Range   Specific Gravity, UA 1.011 1.005 - 1.030   pH, UA 6.5 5.0 - 7.5   Color, UA Yellow Yellow   Appearance Ur Clear Clear   Leukocytes,UA Negative Negative   Protein,UA Negative Negative/Trace   Glucose, UA Negative Negative   Ketones, UA Negative Negative   RBC, UA Negative Negative   Bilirubin, UA Negative Negative    Urobilinogen, Ur 0.2 0.2 - 1.0 mg/dL   Nitrite, UA Negative Negative   Microscopic Examination Comment     Comment: Microscopic follows if indicated.   Microscopic Examination See below:     Comment: Microscopic was indicated and was performed.   Urinalysis Reflex Comment     Comment: This specimen will not reflex to a Urine Culture.  IGP, Aptima HPV     Status: Abnormal   Collection Time: 04/11/20  3:47 PM  Result Value Ref Range   Interpretation EPCA,MDYB (A)     Comment: EPITHELIAL CELL ABNORMALITY. LOW-GRADE SQUAMOUS INTRAEPITHELIAL LESION (LSIL); MILD DYSPLASIA.    Category LSIL (A)     Comment: Low-Grade Squamous Intraepithelial Lesion   Adequacy ENDO     Comment: Satisfactory for evaluation. Endocervical and/or squamous metaplastic cells (endocervical component) are present.    Clinician Provided ICD10 Comment     Comment: Z12.4   Performed by: Comment     Comment: Ivan Anchors, Cytotechnologist (ASCP)   Electronically signed by: Comment     Comment: Adele Barthel, MD, Pathologist   PATHOLOGIST PROVIDED ICD10: Comment     Comment: Y65.035   Note: Comment     Comment: The Pap smear is a screening test designed to aid in the detection of premalignant and malignant conditions of the uterine cervix.  It is not a diagnostic procedure and should not be used as the sole means of detecting cervical cancer.  Both false-positive and false-negative reports do occur.    Test Methodology Comment     Comment: This liquid based ThinPrep(R) pap test was screened with the use of an image guided system.    HPV Aptima Positive (A) Negative    Comment: This nucleic acid amplification test detects fourteen high-risk HPV types (16,18,31,33,35,39,45,51,52,56,58,59,66,68) without differentiation.   Microscopic Examination     Status: None   Collection Time: 04/11/20  3:47 PM   Urine  Result Value  Ref Range   WBC, UA 0-5 0 - 5 /hpf   RBC 0-2 0 - 2 /hpf   Epithelial Cells (non  renal) 0-10 0 - 10 /hpf   Casts None seen None seen /lpf   Bacteria, UA Few None seen/Few    Assessment/Plan: 1. Encounter for general adult medical examination with abnormal findings Annual health maintenance exam today.   2. Moderate protein-calorie malnutrition ([place]) Recommend intake of protein meal replacements to help supplement reduced calorie intake.   3. Chronic rejection of allograft lung [facility]) Patient followed by [facility] transplant center.   4. Routine cervical smear - IGP, Aptima HPV  5. Encounter for screening mammogram for malignant neoplasm of breast - MM DIGITAL SCREENING BILATERAL; Future  6. Dysuria - UA/M w/rflx Culture, Routine  General Counseling: Amberleigh verbalizes understanding of the findings of todays visit and agrees with plan of treatment. I have discussed any further diagnostic evaluation that may be needed or ordered today. We also reviewed her medications today. she has been encouraged to call the office with any questions or concerns that should arise related to todays visit.    Counseling:  This patient was seen by Leretha Pol FNP Collaboration with Dr Lavera Guise as a part of collaborative care agreement  Orders Placed This Encounter  Procedures  . Microscopic Examination  . MM DIGITAL SCREENING BILATERAL  . UA/M w/rflx Culture, Routine     Total time spent: 1 Minutes  Time spent includes review of chart, medications, test results, and follow up plan with the patient.     Lavera Guise, MD  Internal Medicine

## 2020-04-12 LAB — UA/M W/RFLX CULTURE, ROUTINE
Bilirubin, UA: NEGATIVE
Glucose, UA: NEGATIVE
Ketones, UA: NEGATIVE
Leukocytes,UA: NEGATIVE
Nitrite, UA: NEGATIVE
Protein,UA: NEGATIVE
RBC, UA: NEGATIVE
Specific Gravity, UA: 1.011 (ref 1.005–1.030)
Urobilinogen, Ur: 0.2 mg/dL (ref 0.2–1.0)
pH, UA: 6.5 (ref 5.0–7.5)

## 2020-04-12 LAB — MICROSCOPIC EXAMINATION: Casts: NONE SEEN /lpf

## 2020-04-16 LAB — IGP, APTIMA HPV: HPV Aptima: POSITIVE — AB

## 2020-04-27 DIAGNOSIS — Z1231 Encounter for screening mammogram for malignant neoplasm of breast: Secondary | ICD-10-CM | POA: Insufficient documentation

## 2020-04-27 DIAGNOSIS — Z124 Encounter for screening for malignant neoplasm of cervix: Secondary | ICD-10-CM | POA: Insufficient documentation

## 2020-04-27 DIAGNOSIS — T86810 Lung transplant rejection: Secondary | ICD-10-CM | POA: Insufficient documentation

## 2020-06-01 NOTE — Progress Notes (Signed)
Please let the patient know that she had abnormal pap. I know she is followed by [facility] transplant center. Does she want [place] to send her to GYN at [facility]? If so, we need to do referral. Thanks.

## 2020-06-02 ENCOUNTER — Telehealth: Payer: Self-pay

## 2020-06-02 NOTE — Telephone Encounter (Signed)
-----   Message from Ronnell Freshwater, NP sent at 06/01/2020  9:31 PM EDT ----- Please let the patient know that she had abnormal pap. I know she is followed by [facility] transplant center. Does she want [place] to send her to GYN at [facility]? If so, we need to do referral. Thanks.

## 2020-06-02 NOTE — Telephone Encounter (Signed)
Pt notified for result and send beth for referral for GYN at [facility]

## 2020-06-05 ENCOUNTER — Other Ambulatory Visit: Payer: Self-pay | Admitting: Nurse Practitioner

## 2020-06-05 DIAGNOSIS — Z1231 Encounter for screening mammogram for malignant neoplasm of breast: Secondary | ICD-10-CM

## 2020-06-09 ENCOUNTER — Telehealth: Payer: Self-pay

## 2020-06-09 NOTE — Telephone Encounter (Signed)
I dont believe so

## 2020-06-10 NOTE — Telephone Encounter (Signed)
Called Katrina White and [name] and asked for her to fax another form attn to me.

## 2020-07-09 ENCOUNTER — Telehealth: Payer: Self-pay

## 2020-07-09 NOTE — Telephone Encounter (Signed)
[phone] from hospice called that pt refused to go to hospice she is going home with palliative care gave verbal order ok by [facility]

## 2020-07-14 ENCOUNTER — Telehealth: Payer: Self-pay | Admitting: Adult Health Nurse Practitioner

## 2020-07-14 NOTE — Telephone Encounter (Signed)
Spoke with patient's daughter, Aylssa Herrig, regarding the Palliative referral/services and she was in agreement with scheduling visit.  I have scheduled an In-person Consult for 07/25/20 @ 1 PM.

## 2020-07-24 ENCOUNTER — Other Ambulatory Visit: Payer: Medicaid Other | Admitting: Adult Health Nurse Practitioner

## 2020-07-24 ENCOUNTER — Other Ambulatory Visit: Payer: Self-pay

## 2020-07-24 DIAGNOSIS — T86810 Lung transplant rejection: Secondary | ICD-10-CM

## 2020-07-24 DIAGNOSIS — F3341 Major depressive disorder, recurrent, in partial remission: Secondary | ICD-10-CM

## 2020-07-24 DIAGNOSIS — E440 Moderate protein-calorie malnutrition: Secondary | ICD-10-CM

## 2020-07-24 DIAGNOSIS — D849 Immunodeficiency, unspecified: Secondary | ICD-10-CM

## 2020-07-24 DIAGNOSIS — Z515 Encounter for palliative care: Secondary | ICD-10-CM

## 2020-07-24 NOTE — Progress Notes (Addendum)
[facility] Palliative Care Consult Note Telephone: [phone]  Fax: [phone]  PATIENT NAME: Katrina White DOB: 09-17-1960 MRN: [id number]  PRIMARY CARE PROVIDER:   Ronnell Freshwater, NP  REFERRING PROVIDER:  Ronnell Freshwater, NP [address] [place],  [place] [address]  RESPONSIBLE PARTY:   Bristyn Kulesza, daughter [phone]  Chief complaint: Initial palliative consult for complex medical decision-making    RECOMMENDATIONS and PLAN:  1.  Advanced care planning.  Patient is DNR.  Completed MOST form today with options for DNR, limited hospital interventions, antibiotics and IV fluids as indicated and no feeding tube.  Uploaded MOST and DNR to [facility] and left originals in the home.  Spent about 20 minutes going over ACP and filling out forms.  2.  Chronic rejection.  Patient has been in chronic rejection of lung transplant for about a year now.  She is having severe worsening lung function and now requires oxygen at 4 L continuous.  She continues antirejection drugs but is no longer on photopheresis or is being seen by transplant team.  We will have ongoing assessment for readiness for transition to hospice  3.  Immunosuppression.  Patient does take Bactrim 3 times per week prophylactically.  Currently has no signs or symptoms of infection.  Will continue to monitor.  4.  PCM.  Patient is currently on Marinol 10 mg twice daily and Remeron 15 mg nightly.  Her appetite is good and her weight is increasing.  Continue current Marinol and Remeron as ordered  5.  Anxiety/depression.  Patient is on Lexapro 10 mg daily, Remeron 15 mg nightly, Zyprexa 2-1/2 mg nightly.  States that she sleeps well.  Does not feel anxious.  Was given lorazepam and Roxanol upon discharge from hospital for comfort measures.  States that she has not had to take these.  Patient and sister did have questions on how and when to use the lorazepam and Roxanol.  We went over when  and how to use these medications and all questions were answered.  Daughter did have some questions about if Medicaid is paying for her antirejection medications and Medicare pain for hospice if she can have both.  Not sure how to answer this and will reach out to someone who may be able to help with this answer.  Daughter also asking for help in the home to sit with her mother when she is at work as she is uncomfortable leaving her mother alone.  We will reach out to social worker with this request  Palliative will continue to monitor for symptom management/decline and make recommendations as needed.  Will monitor closely for hospice readiness.  Follow-up visit in 4 weeks.    HISTORY OF PRESENT ILLNESS:  Katrina White is a 59 y.o. year old female with multiple medical problems including COPD, bronchiolitis obliterans, immunosuppression, HLD, arthritis, CKD, depression, PCM.  No family history of breast cancer. Palliative Care was asked to help address goals of care.  Patient had bilateral lung transplant in 2012.  Patient has been in chronic rejection for about a year now.  She continues on her antirejection medications.  Patient was undergoing photopheresis but this is now stopped.  In November of this year she started having increased difficulty with her breathing.  Her pulmonary function tests were showing decline.  She was hospitalized 11/12 through 07/07/2020 with hypercapnic respiratory failure.  Reviewed labs from hospital stay.  Patient required BiPAP while in the hospital.  She was  discharged on 4 L of O2 continuous.  Prior to hospitalization was not O2 dependent.  She has increased CO2 trapping.  She was discharged from hospital with orders for hospice.  Did not start hospice services as patient wanted to continue with antirejection medications.  Patient sister present during visit today and contributed to patient's history.  Also spoke with patient's daughter on phone to update on  visit. Did have conversation with patient about continuing decline in rejection of the lung transplant.  Discussed hospice support.  Also discussed likelihood that she is no longer getting benefits from her antirejection medications. Patient now dependent on O2 at 4 L continuous.  She still gets dyspneic with activity.  This has been going on since her hospitalization.  She also states that she has weakness with activity.  Patient is able to walk unassisted for short distances.  She is able to perform ADLs independently but has to take several rest breaks due to dyspnea.  She has hospital bed to help with bed mobility and to help with getting in and out of bed.  Has bedside commode to help conserve energy.  Patient has an home oxygen concentrator and oxygen tanks.  Daughter is also interested in possibility of portable oxygen so that her mother will be able to do things outside of the home. Has wheelchair as patient can only ambulate short distances without getting out of breath and this will worsen. Prior to hospitalization patient was losing weight and at her lowest was 70 pounds.  She has been started on Remeron and Marinol.  Her current weight is 83 pounds.  States that her appetite is good.  She has had a couple episodes of vomiting over the past couple of days in which she is throwing up clear mucus.  Does state that this does not affect her appetite and that she will continue to eat a good meal.  Does state having occasional diarrhea that comes and goes after having colitis a year ago.  Rest of 10 point ROS asked and negative.  CODE STATUS: DNR  PPS: 40% HOSPICE ELIGIBILITY/DIAGNOSIS: TBD  PHYSICAL EXAM:  BP 122/62 HR 94 O2 98% on 4 L  O2 84% on RA General: NAD, frail appearing, thin Eyes: Sclera anicteric and noninjected with no discharge noted Cardiovascular: regular rate and rhythm Pulmonary: Lung sounds diminished with wheezing heard at right upper lobe; normal respiratory rate Abdomen:  soft, nontender, + bowel sounds Extremities: no edema, no joint deformities Skin: no rashes on exposed skin Neurological: Weakness but otherwise nonfocal  PAST MEDICAL HISTORY:  Past Medical History:  Diagnosis Date  . Cancer (HCC)    Skin  . Colitis   . COPD (chronic obstructive pulmonary disease) ([place])   . Emphysema lung ([place])   . 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 Smoking status: Current Every Day Smoker    Types: Cigarettes  . Smokeless tobacco: Never Used  . Tobacco comment: less than a pack a week plan to quit for mothers day  Substance Use Topics  . Alcohol use: Not Currently    Comment: socially (3-4drinks a week)    ALLERGIES:  Allergies  Allergen Reactions  . Nsaids      PERTINENT MEDICATIONS:  Outpatient Encounter Medications as of 07/24/2020  Medication Sig  . aspirin 81 MG chewable tablet Chew 81 mg by mouth daily.   Marland Kitchen atorvastatin (LIPITOR) 40 MG tablet Take  40 mg by mouth daily.  Marland Kitchen azaTHIOprine (IMURAN) 50 MG tablet Take 50 mg by mouth daily.  . Biotin (BIOTIN MAXIMUM STRENGTH) 5 MG CAPS Take 1 capsule by mouth daily.   Marland Kitchen escitalopram (LEXAPRO) 10 MG tablet Take 1 tablet (10 mg total) by mouth daily.  . mirtazapine (REMERON) 15 MG tablet Take 1 tablet (15 mg total) by mouth at bedtime.  . Multiple Vitamin (MULTI-VITAMINS) TABS Take 1 tablet by mouth daily.   Marland Kitchen OLANZapine (ZYPREXA) 15 MG tablet Take 15 mg by mouth at bedtime.  . predniSONE (STERAPRED UNI-PAK 21 TAB) 10 MG (21) TBPK tablet 6 day taper - take by mouth as directed for 6 days (Patient taking differently: 5 mg daily. )  . sulfamethoxazole-trimethoprim (BACTRIM,SEPTRA) 400-80 MG tablet Take 1.5 tablets by mouth daily.   . tacrolimus (PROGRAF) 1 MG capsule Take 2 mg by mouth 2 (two) times daily.  Marland Kitchen thiamine 100 MG tablet Take 100 mg by mouth daily.    No facility-administered encounter medications on file as of 07/24/2020.      Adell Panek Jenetta Downer, NP

## 2020-07-25 ENCOUNTER — Other Ambulatory Visit: Payer: Medicaid Other | Admitting: Adult Health Nurse Practitioner

## 2020-07-25 ENCOUNTER — Other Ambulatory Visit: Payer: Self-pay

## 2020-07-31 ENCOUNTER — Telehealth: Payer: Self-pay

## 2020-07-31 ENCOUNTER — Other Ambulatory Visit: Payer: Medicaid Other | Admitting: Adult Health Nurse Practitioner

## 2020-07-31 DIAGNOSIS — T86810 Lung transplant rejection: Secondary | ICD-10-CM

## 2020-07-31 DIAGNOSIS — Z515 Encounter for palliative care: Secondary | ICD-10-CM

## 2020-07-31 NOTE — Telephone Encounter (Signed)
Spoke palliative NP Amy [phone])  That as per hospice if she can put face to face visit and put order what pt need and [facility] [phone] from hospice will take care about that pt not need to switch company  And also advised pt daughter to call crystal

## 2020-07-31 NOTE — Progress Notes (Signed)
[facility] Consult Note Telephone: [phone]  Fax: [phone]  PATIENT NAME: Katrina White DOB: 05-Sep-1960 MRN: [id number]  PRIMARY CARE PROVIDER:   Ronnell Freshwater, NP  REFERRING PROVIDER:  Ronnell Freshwater, NP [address] [place],  [place] [address]  RESPONSIBLE PARTY:   Korbyn White, daughter [phone]    RECOMMENDATIONS and PLAN:  1.  Advanced care planning.  Patient is DNR.   Spent time coordinating with Katrina White at [facility] office, triage hospice nurse, and RN coordinator to determine best way to get DME for patient.  She had left hospital on 07/07/20 with orders for hospice and DME was sent to patient's home prior to discharge from hospital.  Patient did not admit to hospice and typical procedure for F2F and ordering DME was not done.  Katrina White able to inform what was needed.  Hard script for O2 concentrator for O2 at 4L continuous via nasal canula, semi electric hospital bed, standard weight wheelchair, and bedside commode were along with palliative provider's note from 07/24/20 were sent to triage hospice nurse who forwarded to Katrina White at [facility].  Later CNM needed to be printed, signed, scanned and sent to triage nurse.  I spent 60 minutes providing this consultation.  More than 50% of the time in this consultation was spent coordinating communication.   HISTORY OF PRESENT ILLNESS:  Katrina White is a 59 y.o. year old female with multiple White problems including COPD, bronchiolitis obliterans, immunosuppression, HLD, arthritis, CKD, depression, PCM.  No family history of breast cancer. Palliative Care was asked to help address goals of care.   CODE STATUS: DNR  PPS: 40% HOSPICE ELIGIBILITY/DIAGNOSIS: TBD  PAST White HISTORY:  Past White History:  Diagnosis Date  . Cancer (HCC)    Skin  . Colitis   . COPD (chronic obstructive pulmonary disease) ([facility])   . Emphysema lung ([facility])    . 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 Smoking status: Current Every Day Smoker    Types: Cigarettes  . Smokeless tobacco: Never Used  . Tobacco comment: less than a pack a week plan to quit for mothers day  Substance Use Topics  . Alcohol use: Not Currently    Comment: socially (3-4drinks a week)    ALLERGIES:  Allergies  Allergen Reactions  . Nsaids      PERTINENT MEDICATIONS:  Outpatient Encounter Medications as of 07/31/2020  Medication Sig  . aspirin 81 MG chewable tablet Chew 81 mg by mouth daily.   Marland Kitchen atorvastatin (LIPITOR) 40 MG tablet Take 40 mg by mouth daily.  Marland Kitchen azaTHIOprine (IMURAN) 50 MG tablet Take 50 mg by mouth daily.  . Biotin (BIOTIN MAXIMUM STRENGTH) 5 MG CAPS Take 1 capsule by mouth daily.   Marland Kitchen escitalopram (LEXAPRO) 10 MG tablet Take 1 tablet (10 mg total) by mouth daily.  . mirtazapine (REMERON) 15 MG tablet Take 1 tablet (15 mg total) by mouth at bedtime.  . Multiple Vitamin (MULTI-VITAMINS) TABS Take 1 tablet by mouth daily.   Marland Kitchen OLANZapine (ZYPREXA) 15 MG tablet Take 15 mg by mouth at bedtime.  . predniSONE (STERAPRED UNI-PAK 21 TAB) 10 MG (21) TBPK tablet 6 day taper - take by mouth as directed for 6 days (Patient taking differently: 5 mg daily. )  . sulfamethoxazole-trimethoprim (BACTRIM,SEPTRA) 400-80 MG tablet Take 1.5 tablets by mouth daily.   . tacrolimus (  PROGRAF) 1 MG capsule Take 2 mg by mouth 2 (two) times daily.  Marland Kitchen thiamine 100 MG tablet Take 100 mg by mouth daily.    No facility-administered encounter medications on file as of 07/31/2020.     Katrina Abrell Jenetta Downer, NP

## 2020-07-31 NOTE — Telephone Encounter (Signed)
What do I need to do tor this? Is she in hospice now

## 2020-08-01 ENCOUNTER — Other Ambulatory Visit: Payer: Self-pay

## 2020-08-06 ENCOUNTER — Telehealth: Payer: Self-pay | Admitting: Adult Health Nurse Practitioner

## 2020-08-06 NOTE — Telephone Encounter (Signed)
Spoke with patient's sister about DME issues.  Encouraged to call with any further questions or concerns. Acey Woodfield K. Olena Heckle NP

## 2020-08-07 ENCOUNTER — Ambulatory Visit: Payer: Medicaid Other | Admitting: Nurse Practitioner

## 2020-08-21 ENCOUNTER — Other Ambulatory Visit: Payer: Medicaid Other | Admitting: Adult Health Nurse Practitioner

## 2020-08-21 ENCOUNTER — Other Ambulatory Visit: Payer: Self-pay

## 2020-08-21 DIAGNOSIS — T86810 Lung transplant rejection: Secondary | ICD-10-CM

## 2020-08-21 DIAGNOSIS — R5381 Other malaise: Secondary | ICD-10-CM

## 2020-08-21 DIAGNOSIS — Z515 Encounter for palliative care: Secondary | ICD-10-CM

## 2020-08-21 DIAGNOSIS — R112 Nausea with vomiting, unspecified: Secondary | ICD-10-CM

## 2020-08-21 NOTE — Progress Notes (Signed)
Therapist, nutritional Palliative Care Consult Note Telephone: [phone]  Fax: [phone]  PATIENT NAME: Katrina White DOB: 03/21/1961 MRN: [id number]  PRIMARY CARE PROVIDER:   Carlean Jews, NP  REFERRING PROVIDER:  Carlean Jews, NP [address] [place],  [place] [address]  RESPONSIBLE PARTY:   Katrina White, daughter [phone]  Chief complaint: follow up palliative visit/debility  Due to COVID 19 crisis, this visit was done via telemedicine and it was initiated and consent by this patient and/or family.  Video-audio (telehealth) contact was unable to be done due to technical barriers from the patient's side.    RECOMMENDATIONS and PLAN:  1.  Advanced care planning. Patient is DNR.    2. Chronic rejection of allograft lung.  Patient does have worsening of symptoms related to transplant rejection. Discussed stopping antirejection meds as likelihood that these are no longer providing any benefit.  Discussed support of hospice and she kept inquiring about [facility].  She wants to talk with her care manager with [facility] before making any decisions.  Encouraged to call me back with what she found out and to further discuss transition to hospice  3.  Nausea/vomiting.  Have eprescribed zofran 4 mg Q8 hrs PRN sent to [facility] on [address] in [place].    4.  Debility.  Patient is getting weaker and more tired.  Have encouraged allowing family to help her with ADLs.  Discussed the support that hospice could give her as well.    Will await call back from patient/family to further discuss hospice.  If not wanting to transition to hospice, palliative will continue to follow and will set up next appointment when she calls back.   HISTORY OF PRESENT ILLNESS:  Katrina White is a 59 y.o. year old female with multiple medical problems including COPD, bronchiolitis obliterans, immunosuppression, HLD, arthritis, CKD,  depression, PCM. Palliative Care was asked to help address goals of care.  Reviewed chart and spoke with patient and daughter.  She is getting weaker and gets tired more quickly with any activity. Patient states that she has not taken a shower in 2 weeks due to not being able to do it herself and have encouraged to allow family to help.  Her dyspnea is worsening.  She uses O2 @ 4L.  Appetite the same but she is having a lot of nausea and vomiting with eating.  Thought at first it was with eating popcorn but after stopping it she still is having symptoms.  Has not tried anything else to relieve symptoms.  Rest of 10 point ROS asked and negative.  CODE STATUS: DNR  PPS: 40% HOSPICE ELIGIBILITY/DIAGNOSIS: yes/chronic rejection of allograft lung  PHYSICAL EXAM:   Deferred   PAST MEDICAL HISTORY:  Past Medical History:  Diagnosis Date  . Cancer (HCC)    Skin  . Colitis   . COPD (chronic obstructive pulmonary disease) ([facility])   . Emphysema lung ([facility])   . 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 Smoking status: Current Every Day Smoker    Types: Cigarettes  . Smokeless tobacco: Never Used  . Tobacco comment: less than a pack a week plan to quit for mothers day  Substance Use Topics  . Alcohol use: Not Currently    Comment: socially (3-4drinks a week)    ALLERGIES:  Allergies  Allergen Reactions  . Nsaids  PERTINENT MEDICATIONS:  Outpatient Encounter Medications as of 08/21/2020  Medication Sig  . aspirin 81 MG chewable tablet Chew 81 mg by mouth daily.   Marland Kitchen atorvastatin (LIPITOR) 40 MG tablet Take 40 mg by mouth daily.  Marland Kitchen azaTHIOprine (IMURAN) 50 MG tablet Take 50 mg by mouth daily.  . Biotin (BIOTIN MAXIMUM STRENGTH) 5 MG CAPS Take 1 capsule by mouth daily.   Marland Kitchen escitalopram (LEXAPRO) 10 MG tablet Take 1 tablet (10 mg total) by mouth daily.  . mirtazapine (REMERON) 15 MG tablet Take 1 tablet (15 mg total) by mouth at bedtime.   . Multiple Vitamin (MULTI-VITAMINS) TABS Take 1 tablet by mouth daily.   Marland Kitchen OLANZapine (ZYPREXA) 15 MG tablet Take 15 mg by mouth at bedtime.  . predniSONE (STERAPRED UNI-PAK 21 TAB) 10 MG (21) TBPK tablet 6 day taper - take by mouth as directed for 6 days (Patient taking differently: 5 mg daily. )  . sulfamethoxazole-trimethoprim (BACTRIM,SEPTRA) 400-80 MG tablet Take 1.5 tablets by mouth daily.   . tacrolimus (PROGRAF) 1 MG capsule Take 2 mg by mouth 2 (two) times daily.  Marland Kitchen thiamine 100 MG tablet Take 100 mg by mouth daily.    No facility-administered encounter medications on file as of 08/21/2020.      Dezhane Staten Jenetta Downer, NP

## 2020-09-09 ENCOUNTER — Emergency Department: Payer: Medicaid Other

## 2020-09-09 ENCOUNTER — Other Ambulatory Visit: Payer: Self-pay

## 2020-09-09 ENCOUNTER — Emergency Department
Admission: EM | Admit: 2020-09-09 | Discharge: 2020-09-09 | Disposition: A | Discharge status: Home / Self Care | Payer: Medicaid Other | Attending: Emergency Medicine | Admitting: Emergency Medicine

## 2020-09-09 DIAGNOSIS — W19XXXA Unspecified fall, initial encounter: Secondary | ICD-10-CM

## 2020-09-09 DIAGNOSIS — S92414A Nondisplaced fracture of proximal phalanx of right great toe, initial encounter for closed fracture: Secondary | ICD-10-CM | POA: Diagnosis not present

## 2020-09-09 DIAGNOSIS — S0512XA Contusion of eyeball and orbital tissues, left eye, initial encounter: Secondary | ICD-10-CM | POA: Diagnosis not present

## 2020-09-09 DIAGNOSIS — F17210 Nicotine dependence, cigarettes, uncomplicated: Secondary | ICD-10-CM | POA: Insufficient documentation

## 2020-09-09 DIAGNOSIS — Y9301 Activity, walking, marching and hiking: Secondary | ICD-10-CM | POA: Insufficient documentation

## 2020-09-09 DIAGNOSIS — E0922 Drug or chemical induced diabetes mellitus with diabetic chronic kidney disease: Secondary | ICD-10-CM | POA: Diagnosis not present

## 2020-09-09 DIAGNOSIS — Z85828 Personal history of other malignant neoplasm of skin: Secondary | ICD-10-CM | POA: Diagnosis not present

## 2020-09-09 DIAGNOSIS — Z853 Personal history of malignant neoplasm of breast: Secondary | ICD-10-CM | POA: Diagnosis not present

## 2020-09-09 DIAGNOSIS — S8991XA Unspecified injury of right lower leg, initial encounter: Secondary | ICD-10-CM | POA: Diagnosis present

## 2020-09-09 DIAGNOSIS — S8010XA Contusion of unspecified lower leg, initial encounter: Secondary | ICD-10-CM | POA: Diagnosis not present

## 2020-09-09 DIAGNOSIS — Z7982 Long term (current) use of aspirin: Secondary | ICD-10-CM | POA: Diagnosis not present

## 2020-09-09 DIAGNOSIS — S0511XA Contusion of eyeball and orbital tissues, right eye, initial encounter: Secondary | ICD-10-CM | POA: Insufficient documentation

## 2020-09-09 DIAGNOSIS — Z79899 Other long term (current) drug therapy: Secondary | ICD-10-CM | POA: Diagnosis not present

## 2020-09-09 DIAGNOSIS — W01198A Fall on same level from slipping, tripping and stumbling with subsequent striking against other object, initial encounter: Secondary | ICD-10-CM | POA: Insufficient documentation

## 2020-09-09 DIAGNOSIS — S0990XA Unspecified injury of head, initial encounter: Secondary | ICD-10-CM | POA: Insufficient documentation

## 2020-09-09 DIAGNOSIS — J449 Chronic obstructive pulmonary disease, unspecified: Secondary | ICD-10-CM | POA: Diagnosis not present

## 2020-09-09 DIAGNOSIS — N183 Chronic kidney disease, stage 3 unspecified: Secondary | ICD-10-CM | POA: Diagnosis not present

## 2020-09-09 MED ORDER — HYDROCODONE-ACETAMINOPHEN 5-325 MG PO TABS
1.0000 | ORAL_TABLET | Freq: Once | ORAL | Status: AC
  Administered 2020-09-09: 1 via ORAL
  Filled 2020-09-09: qty 1

## 2020-09-09 MED ORDER — HYDROCODONE-ACETAMINOPHEN 5-325 MG PO TABS: 1.0000 | ORAL_TABLET | Freq: Four times a day (QID) | ORAL | 0 refills | Status: AC | PRN

## 2020-09-09 NOTE — ED Triage Notes (Signed)
Presents via EMS from home  [place] on Friday  And again 4 other times  Having bruising to face,and foot  Hematoma to head  Bruising noted around eyes

## 2020-09-09 NOTE — Discharge Instructions (Addendum)
Wear your walking boot at all times when weightbearing until you see your primary  Take tylenol as needed for moderate pain   Take the hydocodone for severe pain  Elevate your foot above the level of your head

## 2020-09-09 NOTE — ED Notes (Signed)
camboot applied to right foot.  Good circulation after.

## 2020-09-09 NOTE — ED Triage Notes (Signed)
Pt states she fell about 4 times Friday night into Sunday morning. R foot bruised and swollen. Face bruised. Knees bruised. R wrist pain.   Pt states she was just put on hospice.   A&O, in wheelchair.

## 2020-09-09 NOTE — ED Provider Notes (Signed)
[facility] Emergency Department Provider Note  ____________________________________________   Event Date/Time   First MD Initiated Contact with Patient 09/09/20 1730     (approximate)  I have reviewed the triage vital signs and the nursing notes.   HISTORY  Chief Complaint Fall    HPI Katrina White is a 60 y.o. 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  The patient states that she fell while walking 3 days ago.  She fell going into another room.  She states she has recurrent falls due to weakness related to her lung transplant, but this has been a fairly ongoing and chronic issue.  She states that she is here because she is worried about her right foot.  She has had some aching, throbbing pain in the right foot when she puts weight on it.  Is not particular painful when she is not walking.  She had bruising extending up her leg.  She also had some bruising around her eyes and did hit her head.  No headaches.  No neck pain or stiffness.  No numbness or weakness.  No chest pain.  No other complaints.  Denies any recent fevers or chills.  No recent increase in her work of breathing.  She is on 5 to 6 L at baseline and this is what she has been on.        Past Medical History:  Diagnosis Date  . Cancer (HCC)    Skin  . Colitis   . COPD (chronic obstructive pulmonary disease) ([place])   . Emphysema lung ([place])   . S/P lung transplant ([place]) 2012   BILATERAL    Patient Active Problem List   Diagnosis Date Noted  . 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 Routine cervical smear 04/27/2020  . Encounter for screening mammogram for malignant neoplasm of breast 04/27/2020  . Adjustment disorder with mixed anxiety and depressed mood 09/07/2018  . Urinary tract infection 09/07/2018  . MDD (major depressive disorder), recurrent severe,  without psychosis ([place]) 09/06/2018  . Dysuria 07/06/2018  . Inflammatory polyarthritis ([place]) 01/23/2018  . Diarrhea 01/23/2018  . CKD (chronic kidney disease) stage 3, GFR 30-59 ml/min (HCC) 10/09/2017  . Protein-calorie malnutrition ([place]) 10/09/2017  . Smoking 10/07/2017  . Personal history of malignant neoplasm of skin 08/30/2016  . Poor balance 11/14/2015  . MDD (major depressive disorder), recurrent, in partial remission ([place]) 10/11/2015  . History of atrial flutter 06/04/2015  . History of chronic kidney disease 06/04/2015  . Hypogammaglobulinemia, acquired ([place]) 08/06/2014  . Arthritis, senescent 03/06/2014  . Hyperlipidemia 12/01/2011  . High risk medications (not anticoagulants) long-term use 07/02/2011  . Encounter for general adult medical examination with abnormal findings 07/02/2011  . Immunosuppression ([place]) 06/04/2011  . Steroid-induced diabetes ([place]) 06/04/2011  . Lung transplant status, bilateral ([place]) 06/04/2011  . S/P lung transplant ([place]) 08/23/2010    Past Surgical History:  Procedure Laterality Date  . LUNG TRANSPLANT, DOUBLE    . TUBAL LIGATION      Prior to Admission medications   Medication Sig Start Date End Date Taking? Authorizing Provider  HYDROcodone-acetaminophen (NORCO/VICODIN) 5-325 MG tablet Take 1 tablet by mouth every 6 (six) hours as needed for severe pain. 09/09/20 09/09/21 Yes Duffy Bruce, MD  aspirin 81 MG chewable tablet Chew 81 mg by mouth daily.  05/17/11   [provider]  atorvastatin (LIPITOR) 40  MG tablet Take 40 mg by mouth daily. 11/02/17   [provider]  azaTHIOprine (IMURAN) 50 MG tablet Take 50 mg by mouth daily.    [provider]  Biotin (BIOTIN MAXIMUM STRENGTH) 5 MG CAPS Take 1 capsule by mouth daily.     [provider]  escitalopram (LEXAPRO) 10 MG tablet Take 1 tablet (10 mg total) by mouth daily. 09/07/18   Clapacs, Madie Reno, MD  mirtazapine (REMERON) 15 MG tablet Take 1 tablet (15 mg  total) by mouth at bedtime. 09/07/18   Clapacs, Madie Reno, MD  Multiple Vitamin (MULTI-VITAMINS) TABS Take 1 tablet by mouth daily.  09/29/10   [provider]  OLANZapine (ZYPREXA) 15 MG tablet Take 15 mg by mouth at bedtime.    [provider]  predniSONE (STERAPRED UNI-PAK 21 TAB) 10 MG (21) TBPK tablet 6 day taper - take by mouth as directed for 6 days Patient taking differently: 5 mg daily.  12/30/17   Ronnell Freshwater, NP  sulfamethoxazole-trimethoprim (BACTRIM,SEPTRA) 400-80 MG tablet Take 1.5 tablets by mouth daily.  10/31/17   [provider]  tacrolimus (PROGRAF) 1 MG capsule Take 2 mg by mouth 2 (two) times daily. 12/28/17   [provider]  thiamine 100 MG tablet Take 100 mg by mouth daily.  10/13/15   [provider]    Allergies Nsaids  Family History  Problem Relation Age of Onset  . Breast cancer Neg Hx     Social History Social History   Tobacco Use  . Smoking status: Current Every Day Smoker    Types: Cigarettes  . Smokeless tobacco: Never Used  . Tobacco comment: less than a pack a week plan to quit for mothers day  Substance Use Topics  . Alcohol use: Not Currently    Comment: socially (3-4drinks a week)  . Drug use: Not Currently    Types: Marijuana    Review of Systems  Review of Systems  Constitutional: Negative for fatigue and fever.  HENT: Negative for congestion and sore throat.   Eyes: Negative for visual disturbance.  Respiratory: Negative for cough and shortness of breath.   Cardiovascular: Negative for chest pain.  Gastrointestinal: Negative for abdominal pain, diarrhea, nausea and vomiting.  Genitourinary: Negative for flank pain.  Musculoskeletal: Positive for arthralgias and gait problem. Negative for back pain and neck pain.  Skin: Negative for rash and wound.  Neurological: Negative for weakness.  All other systems reviewed and are negative.    ____________________________________________  PHYSICAL  EXAM:      VITAL SIGNS: ED Triage Vitals  Enc Vitals Group     BP 09/09/20 1619 133/79     Pulse Rate 09/09/20 1619 95     Resp 09/09/20 1619 18     Temp 09/09/20 1619 98.7 F (37.1 C)     Temp Source 09/09/20 1619 Oral     SpO2 09/09/20 1619 100 %     Weight 09/09/20 1619 70 lb (31.8 kg)     Height 09/09/20 1619 5\' 4"  (1.626 m)     Head Circumference --      Peak Flow --      Pain Score 09/09/20 1622 0     Pain Loc --      Pain Edu? --      Excl. in [place]? --      Physical Exam Vitals and nursing note reviewed.  Constitutional:      General: She is not in acute distress.  Appearance: She is well-developed.  HENT:     Head: Normocephalic and atraumatic.     Comments: Moderate periorbital ecchymoses.  No obvious deformity.  No hematoma.  No open wounds. Eyes:     Conjunctiva/sclera: Conjunctivae normal.  Cardiovascular:     Rate and Rhythm: Normal rate and regular rhythm.     Heart sounds: Normal heart sounds. No murmur heard. No friction rub.  Pulmonary:     Effort: Pulmonary effort is normal. No respiratory distress.     Breath sounds: Normal breath sounds. No wheezing or rales.  Abdominal:     General: There is no distension.     Palpations: Abdomen is soft.     Tenderness: There is no abdominal tenderness.  Musculoskeletal:     Cervical back: Neck supple.  Skin:    General: Skin is warm.     Capillary Refill: Capillary refill takes less than 2 seconds.  Neurological:     Mental Status: She is alert and oriented to person, place, and time.     Motor: No abnormal muscle tone.      LOWER EXTREMITY EXAM: Right  INSPECTION & PALPATION: Extensive ecchymoses throughout the right foot extending to the proximal lower leg.  There is moderate edema and tenderness about the base of the great toe.  No open wounds.  SENSORY: sensation is intact to light touch in:  Superficial peroneal nerve distribution (over dorsum of foot) Deep peroneal nerve distribution (over  first dorsal web space) Sural nerve distribution (over lateral aspect 5th metatarsal) Saphenous nerve distribution (over medial instep)  MOTOR:  + Motor EHL (great toe dorsiflexion) + FHL (great toe plantar flexion)  + TA (ankle dorsiflexion)  + GSC (ankle plantar flexion)  VASCULAR: 2+ dorsalis pedis and posterior tibialis pulses Capillary refill < 2 sec, toes warm and well-perfused  COMPARTMENTS: Soft, warm, well-perfused No pain with passive extension No parethesias   ____________________________________________   LABS (all labs ordered are listed, but only abnormal results are displayed)  Labs Reviewed - No data to display  ____________________________________________  EKG:  ________________________________________  RADIOLOGY All imaging, including plain films, CT scans, and ultrasounds, independently reviewed by me, and interpretations confirmed via formal radiology reads.  ED MD interpretation:   X-ray wrist: Negative CT head/C-spine: Negative, degenerative disease CT face: No fracture X-ray foot right: Nondisplaced fracture at the base of the first proximal phalanx  Official radiology report(s): DG Wrist Complete Right  Result Date: 09/09/2020 CLINICAL DATA:  Status post multiple falls. EXAM: RIGHT WRIST - COMPLETE 3+ VIEW COMPARISON:  None. FINDINGS: There is no evidence of an acute fracture or dislocation. Mild degenerative changes are seen involving the right wrist. Soft tissues are unremarkable. IMPRESSION: 1. No acute fracture or dislocation. Electronically Signed   By: Virgina Norfolk M.D.   On: 09/09/2020 17:10   CT Head Wo Contrast  Result Date: 09/09/2020 CLINICAL DATA:  Recent fall several days ago headaches facial pain, initial encounter EXAM: CT HEAD WITHOUT CONTRAST CT MAXILLOFACIAL WITHOUT CONTRAST CT CERVICAL SPINE WITHOUT CONTRAST TECHNIQUE: Multidetector CT imaging of the head, cervical spine, and maxillofacial structures were performed using  the standard protocol without intravenous contrast. Multiplanar CT image reconstructions of the cervical spine and maxillofacial structures were also generated. COMPARISON:  None. FINDINGS: CT HEAD FINDINGS Brain: No evidence of acute infarction, hemorrhage, hydrocephalus, extra-axial collection or mass lesion/mass effect. Vascular: No hyperdense vessel or unexpected calcification. Skull: Normal. Negative for fracture or focal lesion. Other: Small left frontal scalp hematoma is  noted. CT MAXILLOFACIAL FINDINGS Osseous: No fracture or mandibular dislocation. No destructive process. Orbits: Negative. No traumatic or inflammatory finding. Sinuses: Paranasal sinuses show no air-fluid levels. Prominent concha bullosa is noted in the right middle turbinate on the right. Soft tissues: Mild soft tissue swelling is noted over the right cheek. No sizable hematoma is noted. CT CERVICAL SPINE FINDINGS Alignment: Within normal limits. Skull base and vertebrae: 7 cervical segments are well visualized. Vertebral body height is well maintained. Mild osteophytic changes are noted from C4-C7. Mild facet hypertrophic changes are noted. No acute fracture or acute facet abnormality is noted. Soft tissues and spinal canal: Surrounding soft tissue structures are within normal limits. Upper chest: Visualized lung apices demonstrate some mild pleuroparenchymal scarring. No acute abnormality is seen. Other: None IMPRESSION: CT of the head: No acute intracranial abnormality is noted. Mild left frontal scalp hematoma. CT of the cervical spine: Multilevel degenerative change without acute abnormality. CT of the maxillofacial bones: Mild soft tissue swelling over the right cheek. No acute bony abnormality is noted. Electronically Signed   By: Inez Catalina M.D.   On: 09/09/2020 17:44   CT Cervical Spine Wo Contrast  Result Date: 09/09/2020 CLINICAL DATA:  Recent fall several days ago headaches facial pain, initial encounter EXAM: CT HEAD  WITHOUT CONTRAST CT MAXILLOFACIAL WITHOUT CONTRAST CT CERVICAL SPINE WITHOUT CONTRAST TECHNIQUE: Multidetector CT imaging of the head, cervical spine, and maxillofacial structures were performed using the standard protocol without intravenous contrast. Multiplanar CT image reconstructions of the cervical spine and maxillofacial structures were also generated. COMPARISON:  None. FINDINGS: CT HEAD FINDINGS Brain: No evidence of acute infarction, hemorrhage, hydrocephalus, extra-axial collection or mass lesion/mass effect. Vascular: No hyperdense vessel or unexpected calcification. Skull: Normal. Negative for fracture or focal lesion. Other: Small left frontal scalp hematoma is noted. CT MAXILLOFACIAL FINDINGS Osseous: No fracture or mandibular dislocation. No destructive process. Orbits: Negative. No traumatic or inflammatory finding. Sinuses: Paranasal sinuses show no air-fluid levels. Prominent concha bullosa is noted in the right middle turbinate on the right. Soft tissues: Mild soft tissue swelling is noted over the right cheek. No sizable hematoma is noted. CT CERVICAL SPINE FINDINGS Alignment: Within normal limits. Skull base and vertebrae: 7 cervical segments are well visualized. Vertebral body height is well maintained. Mild osteophytic changes are noted from C4-C7. Mild facet hypertrophic changes are noted. No acute fracture or acute facet abnormality is noted. Soft tissues and spinal canal: Surrounding soft tissue structures are within normal limits. Upper chest: Visualized lung apices demonstrate some mild pleuroparenchymal scarring. No acute abnormality is seen. Other: None IMPRESSION: CT of the head: No acute intracranial abnormality is noted. Mild left frontal scalp hematoma. CT of the cervical spine: Multilevel degenerative change without acute abnormality. CT of the maxillofacial bones: Mild soft tissue swelling over the right cheek. No acute bony abnormality is noted. Electronically Signed   By: Inez Catalina M.D.   On: 09/09/2020 17:44   DG Foot Complete Right  Result Date: 09/09/2020 CLINICAL DATA:  Status post fall. EXAM: RIGHT FOOT COMPLETE - 3+ VIEW COMPARISON:  None. FINDINGS: Acute, nondisplaced fracture is seen involving the base of the proximal phalanx of the right great toe. There is no evidence of dislocation. A chronic fracture of the fifth right metatarsal is seen. Mild chronic changes are also seen along the dorsal aspect of the mid right foot. Moderate severity soft tissue swelling is noted along the lateral aspect of the right ankle. IMPRESSION: Acute fracture of the proximal  phalanx of the right great toe. Electronically Signed   By: Virgina Norfolk M.D.   On: 09/09/2020 17:01   CT Maxillofacial Wo Contrast  Result Date: 09/09/2020 CLINICAL DATA:  Recent fall several days ago headaches facial pain, initial encounter EXAM: CT HEAD WITHOUT CONTRAST CT MAXILLOFACIAL WITHOUT CONTRAST CT CERVICAL SPINE WITHOUT CONTRAST TECHNIQUE: Multidetector CT imaging of the head, cervical spine, and maxillofacial structures were performed using the standard protocol without intravenous contrast. Multiplanar CT image reconstructions of the cervical spine and maxillofacial structures were also generated. COMPARISON:  None. FINDINGS: CT HEAD FINDINGS Brain: No evidence of acute infarction, hemorrhage, hydrocephalus, extra-axial collection or mass lesion/mass effect. Vascular: No hyperdense vessel or unexpected calcification. Skull: Normal. Negative for fracture or focal lesion. Other: Small left frontal scalp hematoma is noted. CT MAXILLOFACIAL FINDINGS Osseous: No fracture or mandibular dislocation. No destructive process. Orbits: Negative. No traumatic or inflammatory finding. Sinuses: Paranasal sinuses show no air-fluid levels. Prominent concha bullosa is noted in the right middle turbinate on the right. Soft tissues: Mild soft tissue swelling is noted over the right cheek. No sizable hematoma is noted.  CT CERVICAL SPINE FINDINGS Alignment: Within normal limits. Skull base and vertebrae: 7 cervical segments are well visualized. Vertebral body height is well maintained. Mild osteophytic changes are noted from C4-C7. Mild facet hypertrophic changes are noted. No acute fracture or acute facet abnormality is noted. Soft tissues and spinal canal: Surrounding soft tissue structures are within normal limits. Upper chest: Visualized lung apices demonstrate some mild pleuroparenchymal scarring. No acute abnormality is seen. Other: None IMPRESSION: CT of the head: No acute intracranial abnormality is noted. Mild left frontal scalp hematoma. CT of the cervical spine: Multilevel degenerative change without acute abnormality. CT of the maxillofacial bones: Mild soft tissue swelling over the right cheek. No acute bony abnormality is noted. Electronically Signed   By: Inez Catalina M.D.   On: 09/09/2020 17:44    ____________________________________________  PROCEDURES   Procedure(s) performed (including Critical Care):  Procedures  ____________________________________________  INITIAL IMPRESSION / MDM / [place] / ED COURSE  As part of my medical decision making, I reviewed the following data within the [facility] notes reviewed and incorporated, Old chart reviewed, Notes from prior ED visits, and [place] Controlled Substance [place] was evaluated in Emergency Department on 09/09/2020 for the symptoms described in the history of present illness. She was evaluated in the context of the global COVID-19 pandemic, which necessitated consideration that the patient might be at risk for infection with the SARS-CoV-2 virus that causes COVID-19. Institutional protocols and algorithms that pertain to the evaluation of patients at risk for COVID-19 are in a state of rapid change based on information released by regulatory bodies including the CDC and federal and  state organizations. These policies and algorithms were followed during the patient's care in the ED.  Some ED evaluations and interventions may be delayed as a result of limited staffing during the pandemic.*     Medical Decision Making: Well-appearing 60 year old female here with right foot pain and facial bruising after fall.  The fall was mechanical in nature.  She does have some generalized weakness but this has been a chronic, ongoing issue and she states that she feels at her baseline and declines medical work-up for this.  Regarding her trauma, she does have a nondisplaced fracture at the base of the first proximal phalanx.  No malrotation.  No  open wounds.  Will place her in a Cam walking boot, advised outpatient orthopedic or primary follow-up.  Analgesics given.  Otherwise, no other evidence of trauma.  CT head, C-spine, face reviewed by me and is negative for abnormality.  She has not blood thinners.  Return precautions given.  ____________________________________________  FINAL CLINICAL IMPRESSION(S) / ED DIAGNOSES  Final diagnoses:  Fall  Closed nondisplaced fracture of proximal phalanx of right great toe, initial encounter     MEDICATIONS GIVEN DURING THIS VISIT:  Medications  HYDROcodone-acetaminophen (NORCO/VICODIN) 5-325 MG per tablet 1 tablet (has no administration in time range)     ED Discharge Orders         Ordered    HYDROcodone-acetaminophen (NORCO/VICODIN) 5-325 MG tablet  Every 6 hours PRN        09/09/20 1803           Note:  This document was prepared using Dragon voice recognition software and may include unintentional dictation errors.   Duffy Bruce, MD 09/09/20 [phone]

## 2020-09-16 ENCOUNTER — Other Ambulatory Visit: Payer: Self-pay

## 2020-09-16 ENCOUNTER — Emergency Department (HOSPITAL_COMMUNITY)
Admission: EM | Admit: 2020-09-16 | Discharge: 2020-09-16 | Disposition: A | Discharge status: Home / Self Care | Payer: Medicaid Other | Attending: Emergency Medicine | Admitting: Emergency Medicine

## 2020-09-16 ENCOUNTER — Encounter (HOSPITAL_COMMUNITY): Payer: Self-pay | Admitting: Emergency Medicine

## 2020-09-16 ENCOUNTER — Emergency Department (HOSPITAL_COMMUNITY): Payer: Medicaid Other

## 2020-09-16 DIAGNOSIS — Z20822 Contact with and (suspected) exposure to covid-19: Secondary | ICD-10-CM | POA: Insufficient documentation

## 2020-09-16 DIAGNOSIS — R4182 Altered mental status, unspecified: Secondary | ICD-10-CM | POA: Diagnosis not present

## 2020-09-16 DIAGNOSIS — N183 Chronic kidney disease, stage 3 unspecified: Secondary | ICD-10-CM | POA: Insufficient documentation

## 2020-09-16 DIAGNOSIS — R0902 Hypoxemia: Secondary | ICD-10-CM | POA: Diagnosis not present

## 2020-09-16 DIAGNOSIS — S4991XA Unspecified injury of right shoulder and upper arm, initial encounter: Secondary | ICD-10-CM | POA: Diagnosis present

## 2020-09-16 DIAGNOSIS — F17210 Nicotine dependence, cigarettes, uncomplicated: Secondary | ICD-10-CM | POA: Insufficient documentation

## 2020-09-16 DIAGNOSIS — Y92009 Unspecified place in unspecified non-institutional (private) residence as the place of occurrence of the external cause: Secondary | ICD-10-CM | POA: Insufficient documentation

## 2020-09-16 DIAGNOSIS — W19XXXA Unspecified fall, initial encounter: Secondary | ICD-10-CM | POA: Insufficient documentation

## 2020-09-16 DIAGNOSIS — S43004A Unspecified dislocation of right shoulder joint, initial encounter: Secondary | ICD-10-CM

## 2020-09-16 DIAGNOSIS — Z85828 Personal history of other malignant neoplasm of skin: Secondary | ICD-10-CM | POA: Insufficient documentation

## 2020-09-16 DIAGNOSIS — S43111A Subluxation of right acromioclavicular joint, initial encounter: Secondary | ICD-10-CM | POA: Insufficient documentation

## 2020-09-16 DIAGNOSIS — R000 Tachycardia, unspecified: Secondary | ICD-10-CM | POA: Insufficient documentation

## 2020-09-16 DIAGNOSIS — J449 Chronic obstructive pulmonary disease, unspecified: Secondary | ICD-10-CM | POA: Insufficient documentation

## 2020-09-16 DIAGNOSIS — Z7982 Long term (current) use of aspirin: Secondary | ICD-10-CM | POA: Insufficient documentation

## 2020-09-16 LAB — CBC WITH DIFFERENTIAL/PLATELET
Abs Immature Granulocytes: 0.04 10*3/uL (ref 0.00–0.07)
Basophils Absolute: 0 10*3/uL (ref 0.0–0.1)
Basophils Relative: 0 %
Eosinophils Absolute: 0 10*3/uL (ref 0.0–0.5)
Eosinophils Relative: 0 %
HCT: 28.9 % — ABNORMAL LOW (ref 36.0–46.0)
Hemoglobin: 8.5 g/dL — ABNORMAL LOW (ref 12.0–15.0)
Immature Granulocytes: 1 %
Lymphocytes Relative: 2 %
Lymphs Abs: 0.1 10*3/uL — ABNORMAL LOW (ref 0.7–4.0)
MCH: 30.1 pg (ref 26.0–34.0)
MCHC: 29.4 g/dL — ABNORMAL LOW (ref 30.0–36.0)
MCV: 102.5 fL — ABNORMAL HIGH (ref 80.0–100.0)
Monocytes Absolute: 0.4 10*3/uL (ref 0.1–1.0)
Monocytes Relative: 6 %
Neutro Abs: 5.9 10*3/uL (ref 1.7–7.7)
Neutrophils Relative %: 91 %
Platelets: 129 10*3/uL — ABNORMAL LOW (ref 150–400)
RBC: 2.82 MIL/uL — ABNORMAL LOW (ref 3.87–5.11)
RDW: 19 % — ABNORMAL HIGH (ref 11.5–15.5)
WBC: 6.5 10*3/uL (ref 4.0–10.5)
nRBC: 0 % (ref 0.0–0.2)

## 2020-09-16 LAB — COMPREHENSIVE METABOLIC PANEL
ALT: 14 U/L (ref 0–44)
AST: 25 U/L (ref 15–41)
Albumin: 3.3 g/dL — ABNORMAL LOW (ref 3.5–5.0)
Alkaline Phosphatase: 78 U/L (ref 38–126)
Anion gap: 14 (ref 5–15)
BUN: 6 mg/dL — ABNORMAL LOW (ref 8–23)
CO2: 43 mmol/L — ABNORMAL HIGH (ref 22–32)
Calcium: 9 mg/dL (ref 8.9–10.3)
Chloride: 78 mmol/L — ABNORMAL LOW (ref 98–111)
Creatinine, Ser: 0.6 mg/dL (ref 0.44–1.00)
GFR, Estimated: >60 mL/min (ref >60)
Glucose, Bld: 176 mg/dL — ABNORMAL HIGH (ref 70–99)
Potassium: 3.7 mmol/L (ref 3.5–5.1)
Sodium: 135 mmol/L (ref 135–145)
Total Bilirubin: 0.9 mg/dL (ref 0.3–1.2)
Total Protein: 6.5 g/dL (ref 6.5–8.1)

## 2020-09-16 LAB — I-STAT VENOUS BLOOD GAS, ED
Acid-Base Excess: 25 mmol/L — ABNORMAL HIGH (ref 0.0–2.0)
Bicarbonate: 52.1 mmol/L — ABNORMAL HIGH (ref 20.0–28.0)
Calcium, Ion: 1.04 mmol/L — ABNORMAL LOW (ref 1.15–1.40)
HCT: 28 % — ABNORMAL LOW (ref 36.0–46.0)
Hemoglobin: 9.5 g/dL — ABNORMAL LOW (ref 12.0–15.0)
O2 Saturation: 81 %
Potassium: 3.7 mmol/L (ref 3.5–5.1)
Sodium: 131 mmol/L — ABNORMAL LOW (ref 135–145)
TCO2: >50 mmol/L — ABNORMAL HIGH (ref 22–32)
pCO2, Ven: 75.1 mmHg (ref 44.0–60.0)
pH, Ven: 7.449 — ABNORMAL HIGH (ref 7.250–7.430)
pO2, Ven: 47 mmHg — ABNORMAL HIGH (ref 32.0–45.0)

## 2020-09-16 LAB — TROPONIN I (HIGH SENSITIVITY)
Troponin I (High Sensitivity): 15 ng/L (ref <18)
Troponin I (High Sensitivity): 17 ng/L (ref <18)

## 2020-09-16 LAB — SARS CORONAVIRUS 2 (TAT 6-24 HRS): SARS Coronavirus 2: NEGATIVE

## 2020-09-16 LAB — AMMONIA: Ammonia: 27 umol/L (ref 9–35)

## 2020-09-16 LAB — SALICYLATE LEVEL: Salicylate Lvl: <7 mg/dL — ABNORMAL LOW (ref 7.0–30.0)

## 2020-09-16 MED ORDER — PREDNISONE 10 MG PO TABS: 10.0000 mg | ORAL_TABLET | Freq: Three times a day (TID) | ORAL | 0 refills | Status: AC

## 2020-09-16 MED ORDER — LACTATED RINGERS IV BOLUS
250.0000 mL | Freq: Once | INTRAVENOUS | Status: AC
  Administered 2020-09-16: 250 mL via INTRAVENOUS

## 2020-09-16 MED ORDER — ACETAMINOPHEN 500 MG PO TABS
1000.0000 mg | ORAL_TABLET | Freq: Once | ORAL | Status: AC
  Administered 2020-09-16: 1000 mg via ORAL
  Filled 2020-09-16: qty 2

## 2020-09-16 NOTE — ED Provider Notes (Signed)
[facility] EMERGENCY DEPARTMENT Provider Note   CSN: [id number] Arrival date & time: 09/16/20  1046     History Chief Complaint  Patient presents with  . Fall    Katrina White is a 60 y.o. female.  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 Daughter reports she went to work this morning and patient was in bed she came back 3 hours later and the patient was lying on the floor unresponsive. Paramedics report that when they arrived patient had a GCS of 5. She was posturing but otherwise had no meaningful response. They started bagging the patient due to hypoxia and brought her to the hospital. In transport patient started to become more awake and started pulling at the bag and was able to answer a few questions. Patient cannot remember what happened and was unaware of where she was at. She currently is denying any pain and initially said she was panicking and was feeling short of breath but now she feels like her breathing is at baseline. She has had frequent falls lately with multiple areas of bruising over her face and lower extremities. She was seen at the [facility] emergency room on 09/10/2019 after a fall and injury to the right lower extremity. She denies new changes in cough, oxygen requirement at home, fever or sore throat.  The history is provided by the patient, the EMS personnel and a relative.  Fall       Past Medical History:  Diagnosis Date  . Cancer (HCC)    Skin  . Colitis   . COPD (chronic obstructive pulmonary disease) ([place])   . Emphysema lung ([place])   . S/P lung transplant ([place]) 2012   BILATERAL    Patient Active Problem List   Diagnosis Date Noted  . 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 Routine cervical smear 04/27/2020  . Encounter for screening mammogram for malignant neoplasm of breast 04/27/2020  . Adjustment disorder with mixed anxiety and depressed mood 09/07/2018  . Urinary tract infection 09/07/2018  . MDD (major depressive disorder), recurrent severe, without psychosis ([place]) 09/06/2018  . Dysuria 07/06/2018  . Inflammatory polyarthritis ([place]) 01/23/2018  . Diarrhea 01/23/2018  . CKD (chronic kidney disease) stage 3, GFR 30-59 ml/min (HCC) 10/09/2017  . Protein-calorie malnutrition ([place]) 10/09/2017  . Smoking 10/07/2017  . Personal history of malignant neoplasm of skin 08/30/2016  . Poor balance 11/14/2015  . MDD (major depressive disorder), recurrent, in partial remission ([place]) 10/11/2015  . History of atrial flutter 06/04/2015  . History of chronic kidney disease 06/04/2015  . Hypogammaglobulinemia, acquired ([place]) 08/06/2014  . Arthritis, senescent 03/06/2014  . Hyperlipidemia 12/01/2011  . High risk medications (not anticoagulants) long-term use 07/02/2011  . Encounter for general adult medical examination with abnormal findings 07/02/2011  . Immunosuppression ([place]) 06/04/2011  . Steroid-induced diabetes ([place]) 06/04/2011  . Lung transplant status, bilateral ([place]) 06/04/2011  . S/P lung transplant ([place]) 08/23/2010    Past Surgical History:  Procedure Laterality Date  . LUNG TRANSPLANT, DOUBLE    . TUBAL LIGATION       OB History   No obstetric history on file.     Family History  Problem Relation Age of Onset  . Breast cancer Neg Hx  Social History   Tobacco Use  . Smoking status: Current Every Day Smoker    Types: Cigarettes  . Smokeless tobacco: Never Used  . Tobacco comment: less than a pack a week plan to quit for mothers day  Substance Use Topics  . Alcohol use: Not Currently    Comment: socially (3-4drinks a week)  . Drug use: Not Currently    Types: Marijuana    Home Medications Prior to  Admission medications   Medication Sig Start Date End Date Taking? Authorizing Provider  aspirin 81 MG chewable tablet Chew 81 mg by mouth daily.  05/17/11   [provider]  atorvastatin (LIPITOR) 40 MG tablet Take 40 mg by mouth daily. 11/02/17   [provider]  azaTHIOprine (IMURAN) 50 MG tablet Take 50 mg by mouth daily.    [provider]  Biotin (BIOTIN MAXIMUM STRENGTH) 5 MG CAPS Take 1 capsule by mouth daily.     [provider]  escitalopram (LEXAPRO) 10 MG tablet Take 1 tablet (10 mg total) by mouth daily. 09/07/18   Clapacs, Jackquline Denmark, MD  HYDROcodone-acetaminophen (NORCO/VICODIN) 5-325 MG tablet Take 1 tablet by mouth every 6 (six) hours as needed for severe pain. 09/09/20 09/09/21  Shaune Pollack, MD  mirtazapine (REMERON) 15 MG tablet Take 1 tablet (15 mg total) by mouth at bedtime. 09/07/18   Clapacs, Jackquline Denmark, MD  Multiple Vitamin (MULTI-VITAMINS) TABS Take 1 tablet by mouth daily.  09/29/10   [provider]  OLANZapine (ZYPREXA) 15 MG tablet Take 15 mg by mouth at bedtime.    [provider]  predniSONE (STERAPRED UNI-PAK 21 TAB) 10 MG (21) TBPK tablet 6 day taper - take by mouth as directed for 6 days Patient taking differently: 5 mg daily.  12/30/17   Carlean Jews, NP  sulfamethoxazole-trimethoprim (BACTRIM,SEPTRA) 400-80 MG tablet Take 1.5 tablets by mouth daily.  10/31/17   [provider]  tacrolimus (PROGRAF) 1 MG capsule Take 2 mg by mouth 2 (two) times daily. 12/28/17   [provider]  thiamine 100 MG tablet Take 100 mg by mouth daily.  10/13/15   [provider]    Allergies    Nsaids  Review of Systems   Review of Systems  All other systems reviewed and are negative.   Physical Exam Updated Vital Signs BP (!) 141/76 (BP Location: Left Arm)   Pulse (!) 104   Temp (!) 96.5 F (35.8 C) (Temporal)   Resp (!) 21   SpO2 92%   Physical Exam Vitals and nursing note reviewed.   Constitutional:      General: She is not in acute distress.    Appearance: She is well-developed and well-nourished. She is cachectic. She is ill-appearing.  HENT:     Head: Normocephalic.     Comments: Significant ecchymosis over the face with evidence of healing hematoma on the forehead    Mouth/Throat:     Mouth: Mucous membranes are dry.  Eyes:     Extraocular Movements: EOM normal.     Pupils: Pupils are equal, round, and reactive to light.  Cardiovascular:     Rate and Rhythm: Regular rhythm. Tachycardia present.     Pulses: Intact distal pulses.     Heart sounds: Normal heart sounds. No murmur heard. No friction rub.  Pulmonary:     Effort: Tachypnea and accessory muscle usage present.     Breath sounds: Decreased breath sounds present. No wheezing or rales.  Abdominal:  General: Bowel sounds are normal. There is no distension.     Palpations: Abdomen is soft.     Tenderness: There is no abdominal tenderness. There is no guarding or rebound.  Musculoskeletal:        General: Tenderness present. Normal range of motion.     Cervical back: No spasms, tenderness or bony tenderness. No pain with movement. Normal range of motion.     Thoracic back: Normal.     Lumbar back: Normal.     Comments: No edema. Significant healing ecchymosis over the right lower extremity from the mid shin down to the toes. She is able to move all of these areas and reports no pain.  New ecchymosis over the right anterior shoulder with pain with ROM and palpation of the humeral head.  No elbow and forearm pain.  Skin:    General: Skin is warm and dry.     Capillary Refill: Capillary refill takes 2 to 3 seconds.     Findings: No rash.  Neurological:     Mental Status: She is alert.     Cranial Nerves: No cranial nerve deficit.     Comments: Oriented to person. She is able to move all extremities without difficulty  Psychiatric:        Mood and Affect: Mood and affect normal.     Comments: Awake  and cooperative     ED Results / Procedures / Treatments   Labs (all labs ordered are listed, but only abnormal results are displayed) Labs Reviewed  CBC WITH DIFFERENTIAL/PLATELET - Abnormal; Notable for the following components:      Result Value   RBC 2.82 (*)    Hemoglobin 8.5 (*)    HCT 28.9 (*)    MCV 102.5 (*)    MCHC 29.4 (*)    RDW 19.0 (*)    Platelets 129 (*)    Lymphs Abs 0.1 (*)    All other components within normal limits  COMPREHENSIVE METABOLIC PANEL - Abnormal; Notable for the following components:   Chloride 78 (*)    CO2 43 (*)    Glucose, Bld 176 (*)    BUN 6 (*)    Albumin 3.3 (*)    All other components within normal limits  SALICYLATE LEVEL - Abnormal; Notable for the following components:   Salicylate Lvl [id number] (*)    All other components within normal limits  I-STAT VENOUS BLOOD GAS, ED - Abnormal; Notable for the following components:   pH, Ven 7.449 (*)    pCO2, Ven 75.1 (*)    pO2, Ven 47.0 (*)    Bicarbonate 52.1 (*)    TCO2 >50 (*)    Acid-Base Excess 25.0 (*)    Sodium 131 (*)    Calcium, Ion 1.04 (*)    HCT 28.0 (*)    Hemoglobin 9.5 (*)    All other components within normal limits  SARS CORONAVIRUS 2 (TAT 6-24 HRS)  AMMONIA  TROPONIN I (HIGH SENSITIVITY)  TROPONIN I (HIGH SENSITIVITY)    EKG EKG Interpretation  Date/Time:  Tuesday September 16 2020 10:50:53 EST Ventricular Rate:  99 PR Interval:    QRS Duration: 87 QT Interval:  385 QTC Calculation: 495 R Axis:   80 Text Interpretation: Sinus rhythm Ventricular premature complex Nonspecific T abnormalities, lateral leads Borderline prolonged QT interval Artifact in lead(s) I II III aVR aVL aVF V1 V2 V4 V5 V6 Confirmed by Blanchie Dessert [phone]) on 09/16/2020 1:04:10 PM   Radiology CT  Head Wo Contrast  Result Date: 09/16/2020 CLINICAL DATA:  Unwitnessed fall.  Altered. EXAM: CT HEAD WITHOUT CONTRAST CT CERVICAL SPINE WITHOUT CONTRAST TECHNIQUE: Multidetector CT imaging of  the head and cervical spine was performed following the standard protocol without intravenous contrast. Multiplanar CT image reconstructions of the cervical spine were also generated. COMPARISON:  09/09/2020 FINDINGS: CT HEAD FINDINGS Brain: No evidence of acute infarction, hemorrhage, hydrocephalus, extra-axial collection or mass lesion/mass effect. Vascular: Atherosclerotic calcifications involving the large vessels of the skull base. No unexpected hyperdense vessel. Skull: Normal. Negative for fracture or focal lesion. Sinuses/Orbits: No acute finding. Other: Previously seen left frontal scalp hematoma has slightly decreased in size. CT CERVICAL SPINE FINDINGS Alignment: Facet joints are aligned without dislocation or traumatic listhesis. Dens and lateral masses are aligned. Skull base and vertebrae: Tiny bony fragments emanating from the anteroinferior endplates of C4 and C5 are similar in appearance to the previous CT and may reflect fragmented osteophytes versus subacute avulsion fractures related to the anterior longitudinal ligament attachment. No disc space widening. No additional fractures identified. No suspicious bone lesion. Soft tissues and spinal canal: No prevertebral soft tissue swelling or hematoma. No evidence of canal hematoma. Disc levels:  Similar mild cervical spondylosis. Upper chest: Biapical pleuroparenchymal scarring. Other: Partially visualized right IJ Port-A-Cath. IMPRESSION: 1. No acute intracranial findings. 2. Tiny bony fragments emanating from the anteroinferior endplates of C4 and C5 are similar in appearance to the previous CT and may reflect fragmented osteophytes versus subacute avulsion fractures. No disc space widening. MRI could be performed for further characterization, as clinically indicated. Electronically Signed   By: Davina Poke D.O.   On: 09/16/2020 12:51   CT Cervical Spine Wo Contrast  Result Date: 09/16/2020 CLINICAL DATA:  Unwitnessed fall.  Altered. EXAM:  CT HEAD WITHOUT CONTRAST CT CERVICAL SPINE WITHOUT CONTRAST TECHNIQUE: Multidetector CT imaging of the head and cervical spine was performed following the standard protocol without intravenous contrast. Multiplanar CT image reconstructions of the cervical spine were also generated. COMPARISON:  09/09/2020 FINDINGS: CT HEAD FINDINGS Brain: No evidence of acute infarction, hemorrhage, hydrocephalus, extra-axial collection or mass lesion/mass effect. Vascular: Atherosclerotic calcifications involving the large vessels of the skull base. No unexpected hyperdense vessel. Skull: Normal. Negative for fracture or focal lesion. Sinuses/Orbits: No acute finding. Other: Previously seen left frontal scalp hematoma has slightly decreased in size. CT CERVICAL SPINE FINDINGS Alignment: Facet joints are aligned without dislocation or traumatic listhesis. Dens and lateral masses are aligned. Skull base and vertebrae: Tiny bony fragments emanating from the anteroinferior endplates of C4 and C5 are similar in appearance to the previous CT and may reflect fragmented osteophytes versus subacute avulsion fractures related to the anterior longitudinal ligament attachment. No disc space widening. No additional fractures identified. No suspicious bone lesion. Soft tissues and spinal canal: No prevertebral soft tissue swelling or hematoma. No evidence of canal hematoma. Disc levels:  Similar mild cervical spondylosis. Upper chest: Biapical pleuroparenchymal scarring. Other: Partially visualized right IJ Port-A-Cath. IMPRESSION: 1. No acute intracranial findings. 2. Tiny bony fragments emanating from the anteroinferior endplates of C4 and C5 are similar in appearance to the previous CT and may reflect fragmented osteophytes versus subacute avulsion fractures. No disc space widening. MRI could be performed for further characterization, as clinically indicated. Electronically Signed   By: Davina Poke D.O.   On: 09/16/2020 12:51   DG  Chest Port 1 View  Result Date: 09/16/2020 CLINICAL DATA:  60 year old female with history of altered mental status. EXAM:  PORTABLE CHEST 1 VIEW COMPARISON:  Chest x-ray 10/29/2016. FINDINGS: Right internal jugular single-lumen porta cath with tip terminating in the right atrium. Lung volumes are normal. No consolidative airspace disease. No pleural effusions. No pneumothorax. No pulmonary nodule or mass noted. Pulmonary vasculature and the cardiomediastinal silhouette are within normal limits. Cerclage wires projecting over the mid thorax. Numerous surgical clips throughout the mediastinum, presumably from prior double lung transplant. Aortic atherosclerosis. IMPRESSION: 1. Postoperative changes and support apparatus, as above. 2. No radiographic evidence of acute cardiopulmonary disease. Electronically Signed   By: Vinnie Langton M.D.   On: 09/16/2020 11:20   DG Shoulder Right Portable  Result Date: 09/16/2020 CLINICAL DATA:  Pain after fall EXAM: PORTABLE RIGHT SHOULDER COMPARISON:  None. FINDINGS: There is no evidence of fracture is seen. There is slight widening of the [facility] joint measuring up to 8 mm in transverse dimension. No widening of the coracoclavicular distance however is seen. Mild overlying soft tissue swelling. IMPRESSION: Findings which could be suggestive of a grade 1 AC joint injury Electronically Signed   By: Prudencio Pair M.D.   On: 09/16/2020 14:07    Procedures Procedures   Medications Ordered in ED Medications - No data to display  ED Course  I have reviewed the triage vital signs and the nursing notes.  Pertinent labs & imaging results that were available during my care of the patient were reviewed by me and considered in my medical decision making (see chart for details).    MDM Rules/Calculators/A&P                          60 year old female with numerous medical problems presenting today after an unwitnessed fall and altered mental status. Patient initially had a  GCS of 5 upon paramedic arrival. Blood sugar and blood pressure were normal. Presumed this was respiratory related as patient's oxygen saturation was low. Here patient is awake and alert. She is satting 92% on her base line home 6 L. Concern for possible hypercarbia related to patient's altered mental status. She does have a recent history of frequent falls but but she personally denies any infectious symptoms. Will talk with daughter for further history. We will continue oxygen at this time. Labs and imaging pending. Will ensure no intracranial hemorrhage or acute cervical fracture due to the fall today. Patient is not anticoagulated at this time. Still currently on her antirejection meds.  11:07 AM Spoke with the patient's daughter who reports that over the last few days she has not quite been herself.  She has been more quiet than normal and yesterday she had multiple loose stools which is unusual.  She has not had any sick contacts that they are aware of and she mostly stays home other than the hospice nurse who comes out to check on her.  Patient is a partial code and wants aggressive measures but no chest compressions.  1:01 PM CT of head and neck neg for acute process.  Possible subacute osteophyte fracture of C3/4 but pt is having no pain and c-spine cleared.  VBG with compensated resp acidosis which is most likely her baseline. CBC with anemia of 8.5 which is unchanged from readings at [facility] 2 months ago and CMP without acute changes.  Sats reamins stable on home 6L.  COVID is pending.  Trop is pending. EKG without acute findings.  CXR without new findings.  1:15 PM Pt now alert and oriented x4 and now remembers what  happened.  She had gotten some tea and was trying to put the TV on her bedside table when she dropped the TV and she fell into the floor.  Patient reports she repeatedly tried to get up and tried to call for help for a long period of time and was unable to do so and eventually just got  too tired.  Here patient's labs appear stable.  VBG appears baseline for the patient.  Imaging is negative for acute bleed or fracture.  She has been satting 100% on her home 6 L.  She was given Tylenol for generalized aches and pains.  2:20 PM Shoulder film shows findings consistent with Grade 1 AC joint injury and pt placed in a sling.  Trop is 15 and 17 and low suspicion for acute cardiac issue today. Spoke with pt's daughter.  She is happy to come and get the pt.  Will d/c home with daughter.  MDM Number of Diagnoses or Management Options   Amount and/or Complexity of Data Reviewed Clinical lab tests: reviewed and ordered Tests in the radiology section of CPT: ordered and reviewed Tests in the medicine section of CPT: ordered and reviewed Decide to obtain previous medical records or to obtain history from someone other than the patient: yes Obtain history from someone other than the patient: yes Review and summarize past medical records: yes Independent visualization of images, tracings, or specimens: yes  Patient Progress Patient progress: improved   Final Clinical Impression(s) / ED Diagnoses Final diagnoses:  Fall, initial encounter  Hypoxia  Shoulder separation, right, initial encounter    Rx / DC Orders ED Discharge Orders         Ordered    predniSONE (DELTASONE) 10 MG tablet  3 times daily        09/16/20 1431           Blanchie Dessert, MD 09/16/20 1435

## 2020-09-16 NOTE — ED Triage Notes (Addendum)
Pt arrives via EMS from home.Pt's dtr reports that she saw pt in bed this morning approx 0530 before she left for work. Pt's sister came to be with pt at 0930 and found  Pt at the end of the bed, with the room appearing as if she had fallen or gotten up. Pt was unresponsive. EMS reports GCS 3, pt was bagged approx 20 min and began to regain consciousness. Pt responsive on arrival and maintaining airway. Pt is hospice pt- with COPD on home 6L O2 regularly. Pt has had multiple falls recently- with bruising to forehead, under eyes, right arm and left leg and foot. EMS reports chin bruising is new.

## 2020-09-16 NOTE — ED Notes (Signed)
Discharge paperwork given to pt along with prescriptions. Pt agreeable to discharge and understands instructions. Esignature pad not working.

## 2020-09-16 NOTE — ED Notes (Signed)
Changed pt into paper scrubs

## 2020-09-16 NOTE — ED Notes (Signed)
This RN spoke with pt's dtr, Marlowe Kays- she will be picking pt up for discharge.

## 2020-09-16 NOTE — Progress Notes (Signed)
Orthopedic Tech Progress Note Patient Details:  Katrina White 1961-03-25 [id number] MD said I could just apply an ARM SLING. Ortho Devices Type of Ortho Device: Arm sling Ortho Device/Splint Location: RUE Ortho Device/Splint Interventions: Ordered,Application,Adjustment   Post Interventions Patient Tolerated: Well Instructions Provided: Care of device   Janit Pagan 09/16/2020, 3:06 PM

## 2020-09-16 NOTE — Progress Notes (Signed)
Responded to ED page to support patient.  Pt. Arrived at ED from after being found down on floor by daughter. Pt. Is now conscious.  Promoted information sharing and ministry of presence.  Will follow as needed.  Jaclynn Major, [facility], [facility], Pager [phone]

## 2020-09-16 NOTE — Discharge Instructions (Addendum)
Today the imaging of her head and neck looks okay, chest x-ray is unchanged, shoulder x-ray shows a mild separation and sprain but only needs a sling.  These things heal on their own and do not require surgery.

## 2020-10-21 DEATH — deceased

## 2021-04-17 ENCOUNTER — Encounter: Payer: Medicaid Other | Admitting: Nurse Practitioner

## 2021-04-17 DIAGNOSIS — Z0289 Encounter for other administrative examinations: Secondary | ICD-10-CM

## 2021-12-02 IMAGING — CT CT HEAD W/O CM
4 series · 15 of 47 positions shown, 17 images · non-contrast
Comparison: 09/09/2020

CLINICAL DATA: Unwitnessed fall.  Altered.

EXAM:
CT HEAD WITHOUT CONTRAST
CT CERVICAL SPINE WITHOUT CONTRAST
TECHNIQUE: Multidetector CT imaging of the head and cervical spine was
performed following the standard protocol without intravenous
contrast. Multiplanar CT image reconstructions of the cervical spine
were also generated.

[Series 3: head bone · axial · 0.43mm/px · z∈[-67,-51]mm · 2 of 81 slices shown]
[im 9/81  bone]
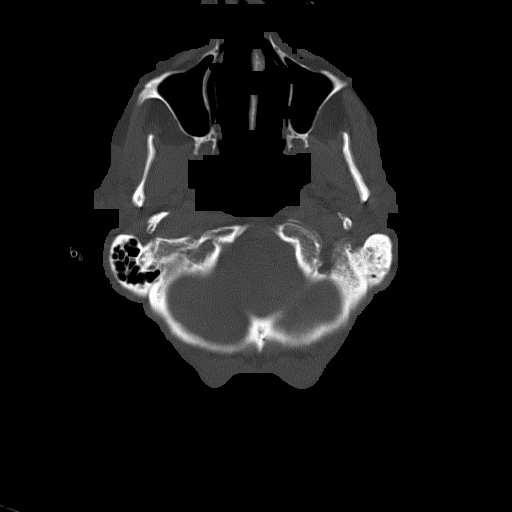
[im 17/81  bone]
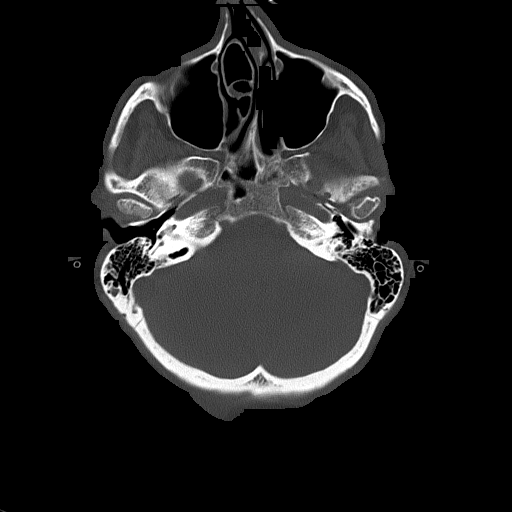

[Series 4: head without · axial · non-contrast · 0.43mm/px · z∈[-63,+57]mm · 7 of 33 slices shown, 9 images]
[im 5/33  brain]
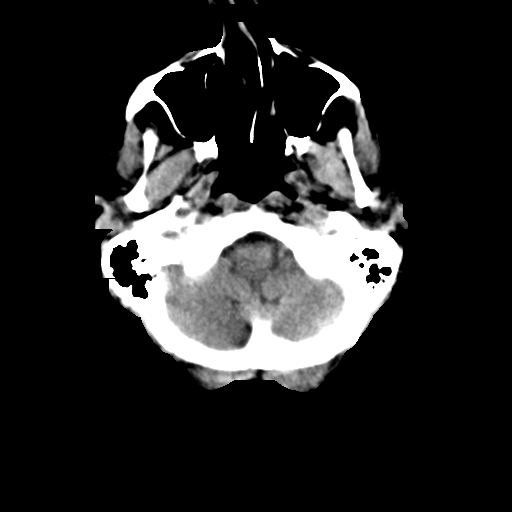
[im 5/33  bone]
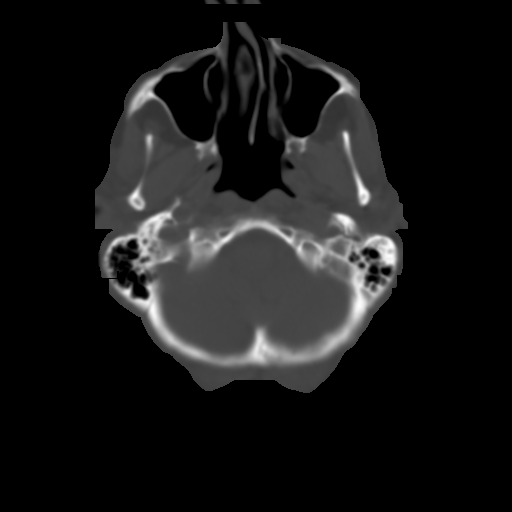
[im 9/33  brain]
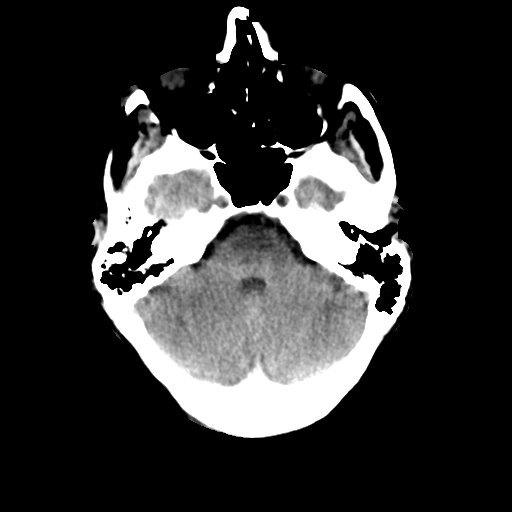
[im 13/33  brain]
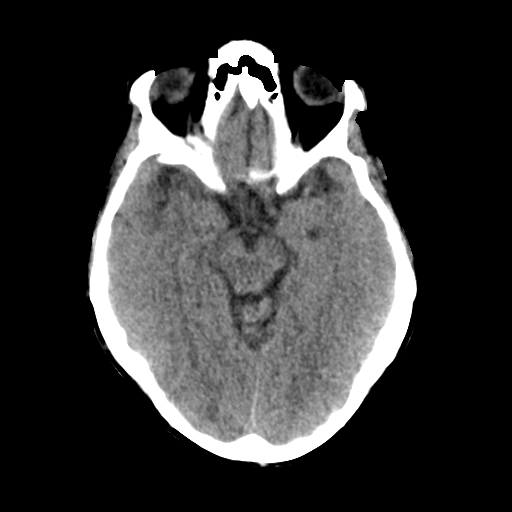
[im 17/33  brain]
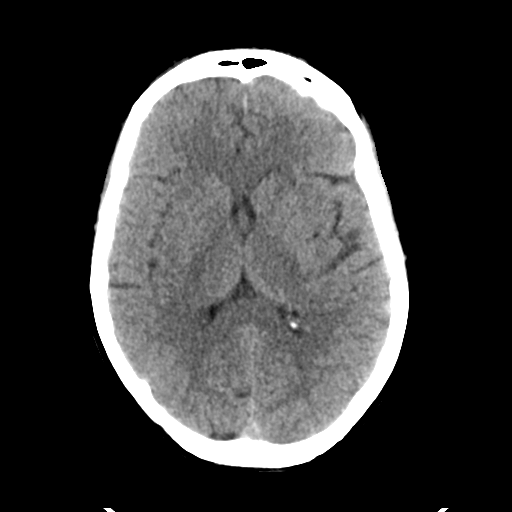
[im 21/33  brain]
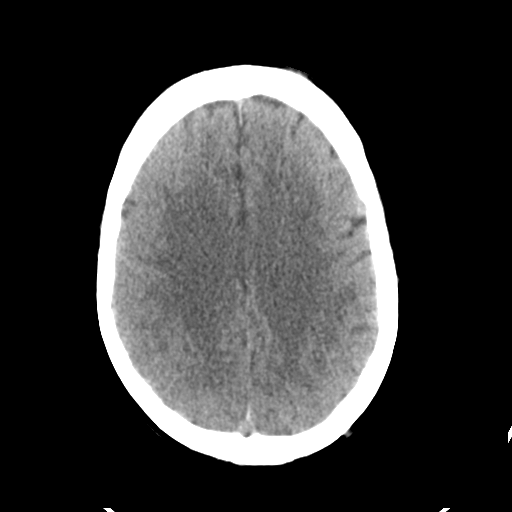
[im 21/33  bone]
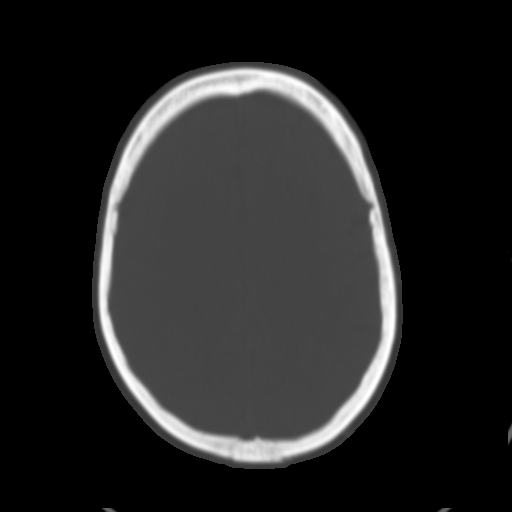
[im 25/33  brain]
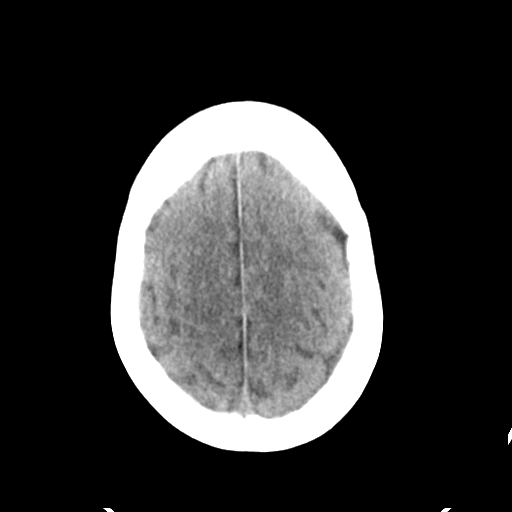
[im 29/33  brain]
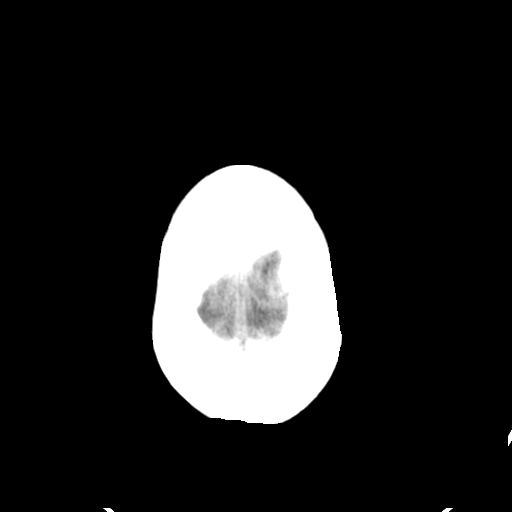

[Series 5: head without cor · coronal · non-contrast · 0.34mm/px · 3 of 64 slices shown]
[im 22/64  brain]
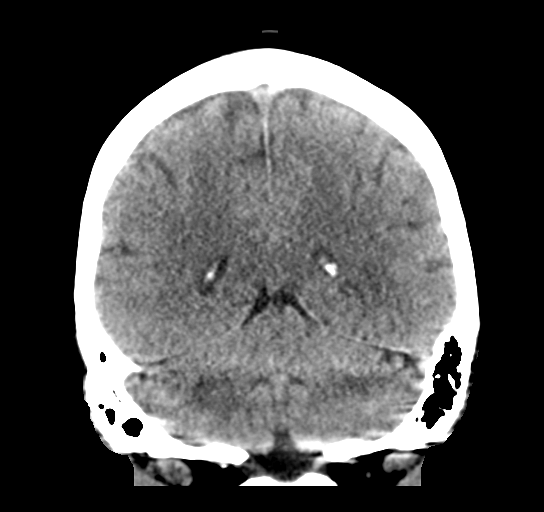
[im 29/64  brain]
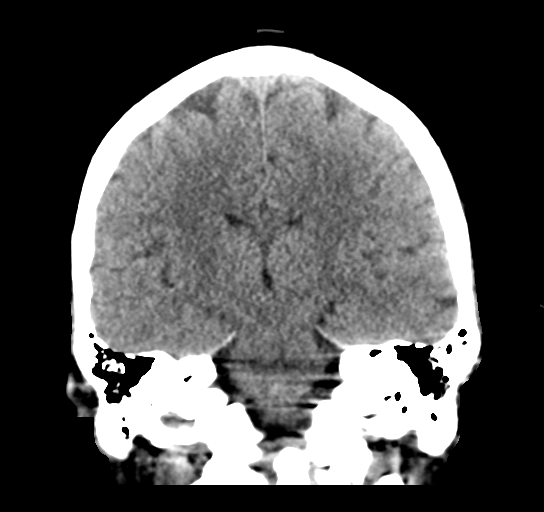
[im 36/64  brain]
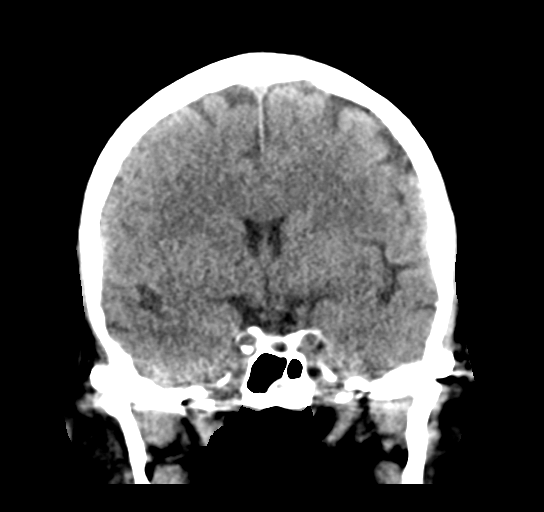

[Series 6: head without sag · sagittal · non-contrast · 0.34mm/px · 3 of 49 slices shown]
[im 17/49  brain]
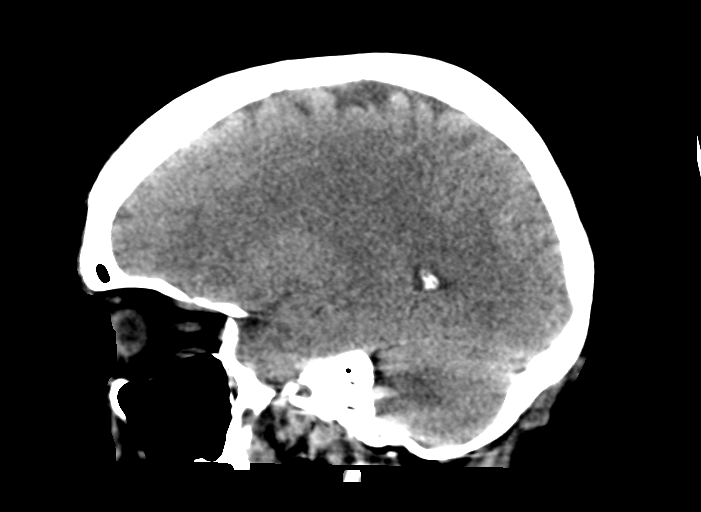
[im 25/49  brain]
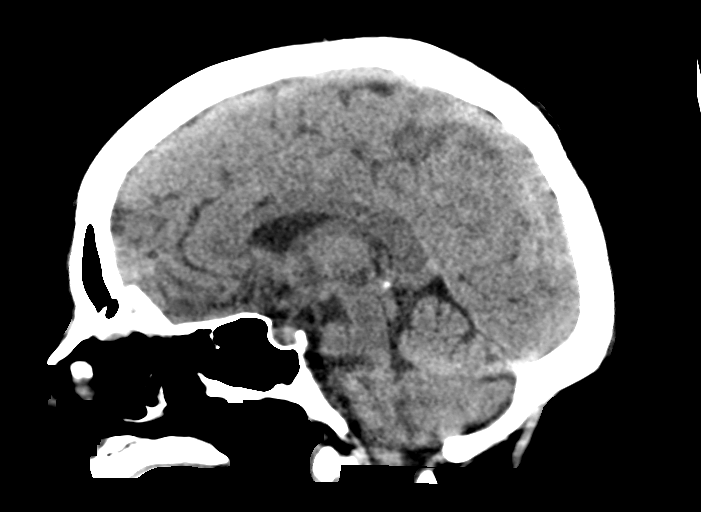
[im 33/49  brain]
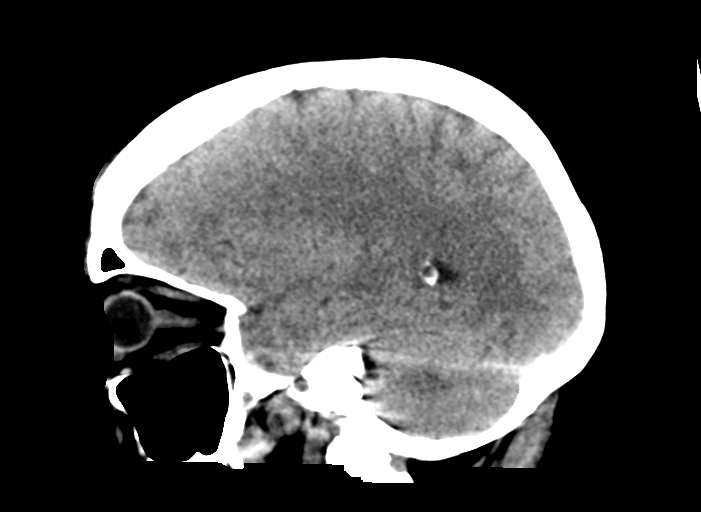

[15 of 47 positions shown; findings below may reference images not displayed]

FINDINGS: CT HEAD FINDINGS

Brain: No evidence of acute infarction, hemorrhage, hydrocephalus,
extra-axial collection or mass lesion/mass effect.

Vascular: Atherosclerotic calcifications involving the large vessels
of the skull base. No unexpected hyperdense vessel.

Skull: Normal. Negative for fracture or focal lesion.

Sinuses/Orbits: No acute finding.

Other: Previously seen left frontal scalp hematoma has slightly
decreased in size.

CT CERVICAL SPINE FINDINGS

Alignment: Facet joints are aligned without dislocation or traumatic
listhesis. Dens and lateral masses are aligned.

Skull base and vertebrae: Tiny bony fragments emanating from the
anteroinferior endplates of C4 and C5 are similar in appearance to
the previous CT and may reflect fragmented osteophytes versus
subacute avulsion fractures related to the anterior longitudinal
ligament attachment. No disc space widening. No additional fractures
identified. No suspicious bone lesion.

Soft tissues and spinal canal: No prevertebral soft tissue swelling
or hematoma. No evidence of canal hematoma.

Disc levels:  Similar mild cervical spondylosis.

Upper chest: Biapical pleuroparenchymal scarring.

Other: Partially visualized right IJ Port-A-Cath.
IMPRESSION: 1. No acute intracranial findings.
2. Tiny bony fragments emanating from the anteroinferior endplates
of C4 and C5 are similar in appearance to the previous CT and may
reflect fragmented osteophytes versus subacute avulsion fractures.
No disc space widening. MRI could be performed for further
characterization, as clinically indicated.
